# Patient Record
Sex: Male | Born: 1970 | Race: White | Hispanic: No | Marital: Married | Smoking: Never smoker
Health system: Southern US, Community
[De-identification: ages and names within clinical notes are randomized; demographics above are authoritative.]

## PROBLEM LIST (undated history)

## (undated) DIAGNOSIS — I10 Essential (primary) hypertension: Secondary | ICD-10-CM

## (undated) HISTORY — PX: TONSILLECTOMY: SUR1361

## (undated) HISTORY — PX: SHOULDER SURGERY: SHX246

## (undated) HISTORY — DX: Essential (primary) hypertension: I10

---

## 1998-12-03 ENCOUNTER — Other Ambulatory Visit: Admission: RE | Admit: 1998-12-03 | Discharge: 1998-12-03 | Payer: Self-pay | Admitting: Otolaryngology

## 2000-06-17 ENCOUNTER — Ambulatory Visit (HOSPITAL_COMMUNITY): Admission: RE | Admit: 2000-06-17 | Discharge: 2000-06-17 | Payer: Self-pay | Admitting: Gastroenterology

## 2000-06-17 ENCOUNTER — Encounter: Payer: Self-pay | Admitting: Gastroenterology

## 2000-06-17 ENCOUNTER — Encounter: Admission: RE | Admit: 2000-06-17 | Discharge: 2000-06-17 | Payer: Self-pay | Admitting: Gastroenterology

## 2002-08-06 ENCOUNTER — Ambulatory Visit (HOSPITAL_BASED_OUTPATIENT_CLINIC_OR_DEPARTMENT_OTHER): Admission: RE | Admit: 2002-08-06 | Discharge: 2002-08-06 | Payer: Self-pay | Admitting: Orthopedic Surgery

## 2002-08-06 ENCOUNTER — Encounter: Payer: Self-pay | Admitting: Orthopedic Surgery

## 2002-08-06 ENCOUNTER — Encounter: Payer: Self-pay | Admitting: Emergency Medicine

## 2002-08-06 ENCOUNTER — Emergency Department (HOSPITAL_COMMUNITY): Admission: EM | Admit: 2002-08-06 | Discharge: 2002-08-06 | Payer: Self-pay | Admitting: Emergency Medicine

## 2003-09-26 ENCOUNTER — Ambulatory Visit (HOSPITAL_BASED_OUTPATIENT_CLINIC_OR_DEPARTMENT_OTHER): Admission: RE | Admit: 2003-09-26 | Discharge: 2003-09-26 | Payer: Self-pay | Admitting: Orthopedic Surgery

## 2003-09-26 ENCOUNTER — Ambulatory Visit (HOSPITAL_COMMUNITY): Admission: RE | Admit: 2003-09-26 | Discharge: 2003-09-26 | Payer: Self-pay | Admitting: Orthopedic Surgery

## 2003-12-16 ENCOUNTER — Emergency Department (HOSPITAL_COMMUNITY): Admission: EM | Admit: 2003-12-16 | Discharge: 2003-12-16 | Payer: Self-pay | Admitting: Emergency Medicine

## 2005-09-11 ENCOUNTER — Emergency Department (HOSPITAL_COMMUNITY): Admission: EM | Admit: 2005-09-11 | Discharge: 2005-09-11 | Payer: Self-pay | Admitting: *Deleted

## 2014-01-27 ENCOUNTER — Encounter (HOSPITAL_COMMUNITY): Payer: Self-pay | Admitting: Emergency Medicine

## 2014-01-27 ENCOUNTER — Emergency Department (HOSPITAL_COMMUNITY): Payer: Managed Care, Other (non HMO)

## 2014-01-27 ENCOUNTER — Emergency Department (HOSPITAL_COMMUNITY)
Admission: EM | Admit: 2014-01-27 | Discharge: 2014-01-27 | Disposition: A | Discharge status: Home / Self Care | Payer: Managed Care, Other (non HMO) | Attending: Emergency Medicine | Admitting: Emergency Medicine

## 2014-01-27 DIAGNOSIS — IMO0002 Reserved for concepts with insufficient information to code with codable children: Secondary | ICD-10-CM | POA: Insufficient documentation

## 2014-01-27 DIAGNOSIS — Y92410 Unspecified street and highway as the place of occurrence of the external cause: Secondary | ICD-10-CM | POA: Insufficient documentation

## 2014-01-27 DIAGNOSIS — S2249XA Multiple fractures of ribs, unspecified side, initial encounter for closed fracture: Secondary | ICD-10-CM | POA: Insufficient documentation

## 2014-01-27 DIAGNOSIS — M546 Pain in thoracic spine: Secondary | ICD-10-CM

## 2014-01-27 DIAGNOSIS — Z79899 Other long term (current) drug therapy: Secondary | ICD-10-CM | POA: Insufficient documentation

## 2014-01-27 DIAGNOSIS — S0510XA Contusion of eyeball and orbital tissues, unspecified eye, initial encounter: Secondary | ICD-10-CM | POA: Insufficient documentation

## 2014-01-27 DIAGNOSIS — S2232XA Fracture of one rib, left side, initial encounter for closed fracture: Secondary | ICD-10-CM

## 2014-01-27 DIAGNOSIS — Y9389 Activity, other specified: Secondary | ICD-10-CM | POA: Insufficient documentation

## 2014-01-27 MED ORDER — MELOXICAM 7.5 MG PO TABS: 15.0000 mg | ORAL_TABLET | Freq: Every day | ORAL | Status: DC

## 2014-01-27 MED ORDER — OXYCODONE-ACETAMINOPHEN 5-325 MG PO TABS: ORAL_TABLET | ORAL | Status: DC
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
## 2014-01-27 NOTE — ED Notes (Signed)
Patient transported to X-ray 

## 2014-01-27 NOTE — Discharge Instructions (Signed)
You may take meloxicam (mobic) an antiinflammatory once daily for next 7 days to help with inflammation and pain.  Then take daily as needed for pain.  Percocet is for moderate to severe pain. Do not drive while taking as it may cause drowsiness. Follow up with primary care for recheck of wounds and ongoing pain as needed.  Use incentive spirometry as demonstrated by nursing staff to help prevent pneumonia.  See below for further instruction.
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
## 2014-01-27 NOTE — ED Notes (Signed)
C/o left rib pain, hurts to move- unable to lift left arm due to pain in ribs.

## 2014-01-28 NOTE — ED Provider Notes (Signed)
Medical screening examination/treatment/procedure(s) were performed by non-physician practitioner and as supervising physician I was immediately available for consultation/collaboration.   Shanna Cisco, MD 01/28/14 [phone]

## 2018-11-01 ENCOUNTER — Other Ambulatory Visit: Payer: Self-pay | Admitting: Orthopedic Surgery

## 2018-11-01 DIAGNOSIS — S43432A Superior glenoid labrum lesion of left shoulder, initial encounter: Secondary | ICD-10-CM

## 2018-11-03 ENCOUNTER — Other Ambulatory Visit: Payer: Self-pay | Admitting: Otolaryngology

## 2018-11-09 ENCOUNTER — Other Ambulatory Visit: Payer: Managed Care, Other (non HMO)

## 2018-11-16 ENCOUNTER — Ambulatory Visit
Admission: RE | Admit: 2018-11-16 | Discharge: 2018-11-16 | Disposition: A | Discharge status: Home / Self Care | Payer: 59 | Source: Ambulatory Visit | Attending: Orthopedic Surgery | Admitting: Orthopedic Surgery

## 2018-11-16 DIAGNOSIS — S43432A Superior glenoid labrum lesion of left shoulder, initial encounter: Secondary | ICD-10-CM

## 2018-11-16 MED ORDER — IOPAMIDOL (ISOVUE-M 200) INJECTION 41%
15.0000 mL | Freq: Once | INTRAMUSCULAR | Status: AC
  Administered 2018-11-16: 15 mL via INTRA_ARTICULAR

## 2019-11-27 ENCOUNTER — Other Ambulatory Visit: Payer: Self-pay | Admitting: Orthopedic Surgery

## 2019-11-27 DIAGNOSIS — M25511 Pain in right shoulder: Secondary | ICD-10-CM

## 2019-12-10 ENCOUNTER — Ambulatory Visit
Admission: RE | Admit: 2019-12-10 | Discharge: 2019-12-10 | Disposition: A | Discharge status: Home / Self Care | Payer: 59 | Source: Ambulatory Visit | Attending: Orthopedic Surgery | Admitting: Orthopedic Surgery

## 2019-12-10 ENCOUNTER — Other Ambulatory Visit: Payer: Self-pay

## 2019-12-10 DIAGNOSIS — M25511 Pain in right shoulder: Secondary | ICD-10-CM

## 2019-12-10 MED ORDER — IOPAMIDOL (ISOVUE-M 200) INJECTION 41%
13.0000 mL | Freq: Once | INTRAMUSCULAR | Status: AC
  Administered 2019-12-10: 13 mL via INTRA_ARTICULAR

## 2019-12-21 ENCOUNTER — Other Ambulatory Visit: Payer: 59

## 2020-04-18 ENCOUNTER — Encounter: Payer: Self-pay | Admitting: Cardiology

## 2020-04-18 ENCOUNTER — Other Ambulatory Visit: Payer: Self-pay

## 2020-04-18 ENCOUNTER — Ambulatory Visit: Payer: 59 | Admitting: Cardiology

## 2020-04-18 VITALS — BP 172/112 | HR 66 | Resp 17 | Ht 70.0 in | Wt 214.0 lb

## 2020-04-18 DIAGNOSIS — R0683 Snoring: Secondary | ICD-10-CM

## 2020-04-18 DIAGNOSIS — I10 Essential (primary) hypertension: Secondary | ICD-10-CM

## 2020-04-18 MED ORDER — AMLODIPINE BESYLATE-VALSARTAN 5-160 MG PO TABS: 1.0000 | ORAL_TABLET | Freq: Every day | ORAL | 2 refills | Status: DC

## 2020-04-18 NOTE — Progress Notes (Signed)
Primary Physician/Referring:  Patient, No Pcp Per  Patient ID: Julian Coffey, male    DOB: 09/24/1970, 49 y.o.   MRN: [id number]  Chief Complaint  Patient presents with  . Hypertension  . New Patient (Initial Visit)   HPI:    Julian Coffey  "Julian Coffey" is a 49 y.o. Caucasian male self-referred to me for evaluation of hypertension.  Recently underwent left shoulder arthroplasty, was found to have markedly elevated blood pressure and the procedure was on the verge of being canceled.  But due to his excellent health in general and good exercise tolerance underwent surgery without any periprocedural complications.  States that for the past couple years, he has noticed elevated blood pressure but did not think much about it.  Recently he has been noticing heaviness in his head and generally not feeling well.  He has continued to exercise regularly.  Past Medical History:  Diagnosis Date  . Hypertension    Past Surgical History:  Procedure Laterality Date  . SHOULDER SURGERY Bilateral   . TONSILLECTOMY     Family History  Problem Relation Age of Onset  . Hypertension Mother   . Hypertension Father     Social History   Tobacco Use  . Smoking status: Never Smoker  . Smokeless tobacco: Current User    Types: Snuff  Substance Use Topics  . Alcohol use: Yes    Comment: occ   Marital Status: Married  ROS  Review of Systems  Cardiovascular: Negative for chest pain, dyspnea on exertion and leg swelling.  Respiratory: Positive for snoring.   Gastrointestinal: Negative for melena.   Objective  Blood pressure (!) 172/112, pulse 66, resp. rate 17, height '5\' 10"'  (1.778 m), weight (!) 214 lb (97.1 kg), SpO2 99 %.  Vitals with BMI 04/18/2020 01/27/2014  Height '5\' 10"'  '5\' 10"'   Weight 214 lbs 200 lbs  BMI 53.61 44.3  Systolic 154 008  Diastolic 676 195  Pulse 66 64     Physical Exam Constitutional:      Appearance: He is obese.  Cardiovascular:     Rate and Rhythm: Normal rate and  regular rhythm.     Pulses: Intact distal pulses.     Heart sounds: Normal heart sounds. No murmur heard.  No gallop.      Comments: No leg edema, no JVD. Pulmonary:     Effort: Pulmonary effort is normal.     Breath sounds: Normal breath sounds.  Abdominal:     General: Bowel sounds are normal.     Palpations: Abdomen is soft.    Laboratory examination:   Recent Labs    04/18/20 1636  NA 139  K 5.1  CL 99  CO2 27  GLUCOSE 85  BUN 15  CREATININE 1.10  CALCIUM 9.3  GFRNONAA 77  GFRAA 89   estimated creatinine clearance is 92.8 mL/min (by C-G formula based on SCr of 1.1 mg/dL).  CMP Latest Ref Rng & Units 04/18/2020  Glucose 65 - 99 mg/dL 85  BUN 6 - 24 mg/dL 15  Creatinine 0.76 - 1.27 mg/dL 1.10  Sodium 134 - 144 mmol/L 139  Potassium 3.5 - 5.2 mmol/L 5.1  Chloride 96 - 106 mmol/L 99  CO2 20 - 29 mmol/L 27  Calcium 8.7 - 10.2 mg/dL 9.3   No flowsheet data found.  Lipid Panel No results for input(s): CHOL, TRIG, LDLCALC, VLDL, HDL, CHOLHDL, LDLDIRECT in the last 8760 hours.  HEMOGLOBIN A1C No results found for: HGBA1C, MPG  TSH No results for input(s): TSH in the last 8760 hours.  Medications and allergies  Not on File   Current Outpatient Medications  Medication Instructions  . amLODipine-valsartan (EXFORGE) 5-160 MG tablet 1 tablet, Oral, Daily  . omeprazole (PRILOSEC) 20 MG capsule 1 capsule, Oral, Daily    Radiology:   No results found.  Cardiac Studies:   EKG  EKG 04/18/2020: Normal sinus rhythm at rate of 64 bpm, leftward axis.  Poor R wave progression, probably normal variant but cannot exclude anteroseptal infarct old.  No evidence of ischemia, normal QT interval.      Assessment     ICD-10-CM   1. Accelerated hypertension  I10 EKG 12-Lead    amLODipine-valsartan (EXFORGE) 5-160 MG tablet    Ambulatory referral to Sleep Studies    Basic metabolic panel    PCV ECHOCARDIOGRAM COMPLETE    Lipid Panel With LDL/HDL Ratio    TSH     CMP14+EGFR    CBC  2. Loud snoring  R06.83 Ambulatory referral to Sleep Studies     Medications Discontinued During This Encounter  Medication Reason  . meloxicam (MOBIC) 7.5 MG tablet Completed Course  . oxyCODONE-acetaminophen (PERCOCET/ROXICET) 5-325 MG per tablet Completed Course  . PRESCRIPTION MEDICATION Completed Course    Recommendations:   FIN HUPP  "Julian Coffey" is a 49 y.o. Caucasian male self-referred to me for evaluation of hypertension.  Recently underwent left shoulder arthroplasty, was found to have markedly elevated blood pressure and the procedure was on the verge of being canceled.  But due to his excellent health in general and good exercise tolerance underwent surgery without any periprocedural complications.  States that for the past couple years, he has noticed elevated blood pressure but did not think much about it.  I obtain BMP today to establish a baseline, potassium is mildly elevated, however he should be able to tolerate amlodipine valsartan, I will recheck labs in 2 weeks.  He will return back to the office in 4 to 6 weeks, will also obtain an echocardiogram.  I have discussed with him regarding need for emergent evaluation and signs and symptoms of CHF, hypertensive emergency, etc.   He also admits to very loud snoring throughout the night, suspected sleep apnea may be contributing to his significant elevation in blood pressure as well.  We will make a referral to be evaluated by Dr. Roddie Mc.  Do not suspect angina pectoris or significant CAD in view of the fact that he exercises regularly and runs at least 3 to 5 miles almost every 2 to 3 days.    Adrian Prows, MD, [facility] 04/19/2020, 2:44 PM Office: [phone]

## 2020-04-19 ENCOUNTER — Other Ambulatory Visit: Payer: Self-pay | Admitting: Cardiology

## 2020-04-19 DIAGNOSIS — I10 Essential (primary) hypertension: Secondary | ICD-10-CM

## 2020-04-19 LAB — BASIC METABOLIC PANEL
BUN/Creatinine Ratio: 14 (ref 9–20)
BUN: 15 mg/dL (ref 6–24)
CO2: 27 mmol/L (ref 20–29)
Calcium: 9.3 mg/dL (ref 8.7–10.2)
Chloride: 99 mmol/L (ref 96–106)
Creatinine, Ser: 1.1 mg/dL (ref 0.76–1.27)
GFR calc Af Amer: 89 mL/min/{1.73_m2} (ref >59)
GFR calc non Af Amer: 77 mL/min/{1.73_m2} (ref >59)
Glucose: 85 mg/dL (ref 65–99)
Potassium: 5.1 mmol/L (ref 3.5–5.2)
Sodium: 139 mmol/L (ref 134–144)

## 2020-05-02 LAB — CMP14+EGFR
ALT: 54 IU/L — ABNORMAL HIGH (ref 0–44)
AST: 42 IU/L — ABNORMAL HIGH (ref 0–40)
Albumin/Globulin Ratio: 1.8 (ref 1.2–2.2)
Albumin: 4.6 g/dL (ref 3.8–4.9)
Alkaline Phosphatase: 88 IU/L (ref 48–121)
BUN/Creatinine Ratio: 12 (ref 9–20)
BUN: 14 mg/dL (ref 6–24)
Bilirubin Total: 0.3 mg/dL (ref 0.0–1.2)
CO2: 25 mmol/L (ref 20–29)
Calcium: 9.5 mg/dL (ref 8.7–10.2)
Chloride: 102 mmol/L (ref 96–106)
Creatinine, Ser: 1.17 mg/dL (ref 0.76–1.27)
GFR calc Af Amer: 83 mL/min/{1.73_m2} (ref >59)
GFR calc non Af Amer: 72 mL/min/{1.73_m2} (ref >59)
Globulin, Total: 2.5 g/dL (ref 1.5–4.5)
Glucose: 108 mg/dL — ABNORMAL HIGH (ref 65–99)
Potassium: 5 mmol/L (ref 3.5–5.2)
Sodium: 140 mmol/L (ref 134–144)
Total Protein: 7.1 g/dL (ref 6.0–8.5)

## 2020-05-02 LAB — CBC
Hematocrit: 46.4 % (ref 37.5–51.0)
Hemoglobin: 16.7 g/dL (ref 13.0–17.7)
MCH: 32.8 pg (ref 26.6–33.0)
MCHC: 36 g/dL — ABNORMAL HIGH (ref 31.5–35.7)
MCV: 91 fL (ref 79–97)
Platelets: 231 10*3/uL (ref 150–450)
RBC: 5.09 x10E6/uL (ref 4.14–5.80)
RDW: 13.1 % (ref 11.6–15.4)
WBC: 3.5 10*3/uL (ref 3.4–10.8)

## 2020-05-02 LAB — LIPID PANEL WITH LDL/HDL RATIO
Cholesterol, Total: 329 mg/dL — ABNORMAL HIGH (ref 100–199)
HDL: 49 mg/dL (ref >39)
LDL Chol Calc (NIH): 231 mg/dL — ABNORMAL HIGH (ref 0–99)
LDL/HDL Ratio: 4.7 ratio — ABNORMAL HIGH (ref 0.0–3.6)
Triglycerides: 241 mg/dL — ABNORMAL HIGH (ref 0–149)
VLDL Cholesterol Cal: 49 mg/dL — ABNORMAL HIGH (ref 5–40)

## 2020-05-02 LAB — TSH: TSH: 0.924 u[IU]/mL (ref 0.450–4.500)

## 2020-05-05 ENCOUNTER — Ambulatory Visit: Payer: 59

## 2020-05-05 ENCOUNTER — Other Ambulatory Visit: Payer: Self-pay

## 2020-05-05 DIAGNOSIS — I10 Essential (primary) hypertension: Secondary | ICD-10-CM

## 2020-05-08 ENCOUNTER — Other Ambulatory Visit: Payer: Self-pay | Admitting: Cardiology

## 2020-05-08 DIAGNOSIS — I428 Other cardiomyopathies: Secondary | ICD-10-CM

## 2020-05-08 DIAGNOSIS — E7800 Pure hypercholesterolemia, unspecified: Secondary | ICD-10-CM

## 2020-05-08 DIAGNOSIS — I10 Essential (primary) hypertension: Secondary | ICD-10-CM

## 2020-05-08 MED ORDER — ATORVASTATIN CALCIUM 80 MG PO TABS: 80.0000 mg | ORAL_TABLET | Freq: Every day | ORAL | 2 refills | Status: DC

## 2020-05-27 ENCOUNTER — Institutional Professional Consult (permissible substitution): Payer: Self-pay | Admitting: Neurology

## 2020-06-02 ENCOUNTER — Other Ambulatory Visit: Payer: Self-pay

## 2020-06-02 ENCOUNTER — Ambulatory Visit: Payer: 59 | Admitting: Cardiology

## 2020-06-02 ENCOUNTER — Encounter: Payer: Self-pay | Admitting: Cardiology

## 2020-06-02 VITALS — BP 136/84 | HR 77 | Resp 15 | Ht 70.0 in | Wt 210.0 lb

## 2020-06-02 DIAGNOSIS — I10 Essential (primary) hypertension: Secondary | ICD-10-CM

## 2020-06-02 DIAGNOSIS — I428 Other cardiomyopathies: Secondary | ICD-10-CM

## 2020-06-02 DIAGNOSIS — E7800 Pure hypercholesterolemia, unspecified: Secondary | ICD-10-CM

## 2020-06-02 MED ORDER — ATORVASTATIN CALCIUM 80 MG PO TABS: 80.0000 mg | ORAL_TABLET | Freq: Every day | ORAL | 3 refills | Status: DC

## 2020-06-02 MED ORDER — AMLODIPINE BESYLATE-VALSARTAN 5-160 MG PO TABS: 1.0000 | ORAL_TABLET | Freq: Every day | ORAL | 3 refills | Status: DC

## 2020-06-02 NOTE — Progress Notes (Signed)
Primary Physician/Referring:  Patient, No Pcp Per  Patient ID: QUAMAINE WEBB, male    DOB: [birth year], 49 y.o.   MRN: [id number]  Chief Complaint  Patient presents with  . Follow-up    4 weeks  . Hypertension   HPI:    OMA ALPERT  "Gaylyn Rong" is a 49 y.o. Caucasian male self-referred to me for evaluation of hypertension.  Recently underwent left shoulder arthroplasty, was found to have markedly elevated blood pressure and the procedure was on the verge of being canceled.  But due to his excellent health in general and good exercise tolerance underwent surgery without any periprocedural complications.  Presents for 4 week follow up on hypertension, hyperlipidemia, and results of echocardiogram.  At last visit started patient on amlodipine/valsartan 5/160 mg, started on atorvastatin 80 mg, and referred him to Dr. Kandyce Rud for sleep evaluation.  Patient has not gone forward with sleep evaluation at this time.  He reports that he only snores after he has consumed 6-7 alcoholic beverages.  He does not feel he needs sleep evaluation at this point.    Patient monitors blood pressure regularly at home reports numbers averaging 130s/80s.  He is currently asymptomatic, reports the "heaviness in his head" has resolved since starting amlodipine/valsartan.  Denies chest pain, shortness of breath, palpitations, swelling.   Past Medical History:  Diagnosis Date  . Hypertension    Past Surgical History:  Procedure Laterality Date  . SHOULDER SURGERY Bilateral   . TONSILLECTOMY     Family History  Problem Relation Age of Onset  . Hypertension Mother   . Hypertension Father     Social History   Tobacco Use  . Smoking status: Never Smoker  . Smokeless tobacco: Current User    Types: Snuff  Substance Use Topics  . Alcohol use: Yes    Comment: occ   Marital Status: Married   ROS  Review of Systems  Eyes: Negative for visual disturbance.  Cardiovascular: Negative for chest pain, claudication,  dyspnea on exertion, leg swelling, palpitations and syncope.  Respiratory: Positive for snoring (after alcohol consumption ).   Gastrointestinal: Negative for melena.  Neurological: Negative for dizziness and headaches.   Objective  Blood pressure 136/84, pulse 77, resp. rate 15, height 5\' 10"  (1.778 m), weight 210 lb (95.3 kg), SpO2 96 %.  Vitals with BMI 06/02/2020 06/02/2020 04/18/2020  Height - 5\' 10"  5\' 10"   Weight - 210 lbs 214 lbs  BMI - 30.13 30.71  Systolic 136 149 04/20/2020  Diastolic 84 103 112  Pulse 77 71 66     Physical Exam Constitutional:      General: He is not in acute distress.    Appearance: He is obese. He is not ill-appearing.  Cardiovascular:     Rate and Rhythm: Normal rate and regular rhythm.     Pulses: Intact distal pulses.     Heart sounds: Normal heart sounds. No murmur heard.  No gallop.      Comments: No leg edema, no JVD. Pulmonary:     Effort: Pulmonary effort is normal.     Breath sounds: Normal breath sounds.  Neurological:     Mental Status: He is alert.    Laboratory examination:   Recent Labs    04/18/20 1636 05/01/20 0815  NA 139 140  K 5.1 5.0  CL 99 102  CO2 27 25  GLUCOSE 85 108*  BUN 15 14  CREATININE 1.10 1.17  CALCIUM 9.3 9.5  GFRNONAA 77 72  GFRAA 89 83   CrCl cannot be calculated (Patient's most recent lab result is older than the maximum 21 days allowed.).  CMP Latest Ref Rng & Units 05/01/2020 04/18/2020  Glucose 65 - 99 mg/dL 291(B) 85  BUN 6 - 24 mg/dL 14 15  Creatinine 1.66 - 1.27 mg/dL 0.60 0.45  Sodium 997 - 144 mmol/L 140 139  Potassium 3.5 - 5.2 mmol/L 5.0 5.1  Chloride 96 - 106 mmol/L 102 99  CO2 20 - 29 mmol/L 25 27  Calcium 8.7 - 10.2 mg/dL 9.5 9.3  Total Protein 6.0 - 8.5 g/dL 7.1 -  Total Bilirubin 0.0 - 1.2 mg/dL 0.3 -  Alkaline Phos 48 - 121 IU/L 88 -  AST 0 - 40 IU/L 42(H) -  ALT 0 - 44 IU/L 54(H) -   CBC Latest Ref Rng & Units 05/01/2020  WBC 3.4 - 10.8 x10E3/uL 3.5  Hemoglobin 13.0 - 17.7 g/dL  74.1  Hematocrit 42.3 - 51.0 % 46.4  Platelets 150 - 450 x10E3/uL 231    Lipid Panel Recent Labs    05/01/20 0815  CHOL 329*  TRIG 241*  LDLCALC 231*  HDL 49    HEMOGLOBIN A1C No results found for: HGBA1C, MPG TSH Recent Labs    05/01/20 0816  TSH 0.924    Medications and allergies  No Known Allergies   Current Outpatient Medications  Medication Instructions  . amLODipine-valsartan (EXFORGE) 5-160 MG tablet 1 tablet, Oral, Daily  . atorvastatin (LIPITOR) 80 mg, Oral, Daily  . omeprazole (PRILOSEC) 20 MG capsule 1 capsule, Oral, Daily    Radiology:   No results found.  Cardiac Studies:  Echocardiogram 05/05/2020:  Left ventricle cavity is normal in size. Mild concentric hypertrophy of the left ventricle. Mild global hypokinesis. LVEF 45-50%. Normal global wall motion. Normal diastolic filling pattern.  The aortic root is mildly dilated at 3.9 cm.  Trileaflet aortic valve. Mild (Grade I) aortic regurgitation.  Moderate pulmonic regurgitation.  Normal right atrial pressure.    EKG  EKG 04/18/2020: Normal sinus rhythm at rate of 64 bpm, leftward axis.  Poor R wave progression, probably normal variant but cannot exclude anteroseptal infarct old.  No evidence of ischemia, normal QT interval.      Assessment     ICD-10-CM   1. Accelerated hypertension  I10 amLODipine-valsartan (EXFORGE) 5-160 MG tablet  2. Hypercholesteremia  E78.00 Lipid Panel With LDL/HDL Ratio    CT CARDIAC SCORING    atorvastatin (LIPITOR) 80 MG tablet  3. Other cardiomyopathy (HCC)  I42.8 CT CARDIAC SCORING     Medications Discontinued During This Encounter  Medication Reason  . amLODipine-valsartan (EXFORGE) 5-160 MG tablet Reorder  . atorvastatin (LIPITOR) 80 MG tablet Reorder    Recommendations:   JARRIS KORTZ  "Gaylyn Rong" is a 49 y.o. Caucasian male self-referred to me for evaluation of hypertension.  Recently underwent left shoulder arthroplasty, was found to have markedly elevated  blood pressure and the procedure was on the verge of being canceled.  But due to his excellent health in general and good exercise tolerance underwent surgery without any periprocedural complications.  Presents for 4-week follow-up on hypertension, hypercholesterolemia, and results of echocardiogram.  Discussed with patient that echocardiogram revealed mildly decreased LVEF at 45 to 50%.  This reduction in ejection fraction may be secondary to systemic hypertension/hyertensive heart disease, however in light of markedly elevated cholesterol will obtain nuclear stress test to evaluate for coronary artery disease.  Will also order CT cardiac scoring due to  markedly elevated LDL at 231.  Patient is currently taking atorvastatin 80 mg daily, will continue this and recheck lipid profile testing in 4 to 6 weeks.  In regard to hypertension, patient is tolerating amlodipine/valsartan well and renal function is stable.  However patient's blood pressure is still mildly elevated.  Discussed with patient the option of making medication adjustments, however he prefers at this time to focus on losing weight which is likely to improve blood pressure control.  Informed patient to avoid high calorie foods, with the goal of losing 10 pounds in the next 6 to 8 weeks to reach goal weight of 200 pounds.  Of note patient prefers not to do sleep study for evaluation of sleep apnea at this time.  Discussed with patient that if he does have underlying sleep apnea it could be contributing to his hypertension.  Patient expressed understanding but prefers to see if losing weight can improve his blood pressure control before going forward with sleep evaluation.   Will follow up in 6 months on hypercholesterolemia, hypertension, and results of nuclear stress test.  Patient was seen in collaboration with Dr. Jacinto Halim. He also reviewed patient's chart and examined the patient. Dr. Jacinto Halim is in agreement of the plan.    Rayford Halsted,  PA-C 06/02/2020, 4:19 PM Office: [phone]

## 2020-06-11 ENCOUNTER — Ambulatory Visit: Payer: 59

## 2020-06-11 ENCOUNTER — Other Ambulatory Visit: Payer: Self-pay

## 2020-06-11 DIAGNOSIS — I10 Essential (primary) hypertension: Secondary | ICD-10-CM

## 2020-06-11 DIAGNOSIS — E7800 Pure hypercholesterolemia, unspecified: Secondary | ICD-10-CM

## 2020-06-11 DIAGNOSIS — I428 Other cardiomyopathies: Secondary | ICD-10-CM

## 2020-06-24 ENCOUNTER — Ambulatory Visit
Admission: RE | Admit: 2020-06-24 | Discharge: 2020-06-24 | Disposition: A | Discharge status: Home / Self Care | Payer: 59 | Source: Ambulatory Visit | Attending: Student | Admitting: Student

## 2020-06-24 ENCOUNTER — Other Ambulatory Visit: Payer: 59

## 2020-06-24 DIAGNOSIS — I428 Other cardiomyopathies: Secondary | ICD-10-CM

## 2020-06-24 DIAGNOSIS — E7800 Pure hypercholesterolemia, unspecified: Secondary | ICD-10-CM

## 2020-06-25 ENCOUNTER — Telehealth: Payer: Self-pay

## 2020-06-25 ENCOUNTER — Institutional Professional Consult (permissible substitution): Payer: Self-pay | Admitting: Neurology

## 2020-06-25 NOTE — Telephone Encounter (Signed)
Relayed information to patient regarding calcium score. Patient voiced understanding and will do repeat blood work at the end of the month (fasting)

## 2020-06-25 NOTE — Progress Notes (Signed)
Please inform patient that coronary calcium score is mildly elevated, meaning we need to keep your cholesterol controlled to prevent more calcium in your heart arteries. Please keep taking your atorvastatin and get blood work done at [facility] in a fasting state at the end of Oct. To recheck your cholesterol.

## 2020-06-25 NOTE — Telephone Encounter (Signed)
-----   Message from Rayford Halsted, [place] sent at 06/25/2020  8:59 AM EDT ----- Please inform patient that coronary calcium score is mildly elevated, meaning we need to keep your cholesterol controlled to prevent more calcium in your heart arteries. Please keep taking your atorvastatin and get blood work done at [facility] in a fas ting state at the end of Oct. To recheck your cholesterol.

## 2020-06-25 NOTE — Telephone Encounter (Signed)
-----   Message from Celeste C Cantwell, PA-C sent at 06/25/2020  8:59 AM EDT ----- Please inform patient that coronary calcium score is mildly elevated, meaning we need to keep your cholesterol controlled to prevent more calcium in your heart arteries. Please keep taking your atorvastatin and get blood work done at [facility] in a fas ting state at the end of Oct. To recheck your cholesterol.   

## 2020-07-18 ENCOUNTER — Other Ambulatory Visit: Payer: Self-pay | Admitting: Student

## 2020-07-19 LAB — LIPID PANEL WITH LDL/HDL RATIO
Cholesterol, Total: 224 mg/dL — ABNORMAL HIGH (ref 100–199)
HDL: 55 mg/dL (ref >39)
LDL Chol Calc (NIH): 134 mg/dL — ABNORMAL HIGH (ref 0–99)
LDL/HDL Ratio: 2.4 ratio (ref 0.0–3.6)
Triglycerides: 198 mg/dL — ABNORMAL HIGH (ref 0–149)
VLDL Cholesterol Cal: 35 mg/dL (ref 5–40)

## 2020-07-21 ENCOUNTER — Encounter: Payer: Self-pay | Admitting: Student

## 2020-07-21 ENCOUNTER — Telehealth: Payer: Self-pay | Admitting: Student

## 2020-07-21 DIAGNOSIS — E7800 Pure hypercholesterolemia, unspecified: Secondary | ICD-10-CM

## 2020-07-21 NOTE — Progress Notes (Signed)
Call patient and discussed results

## 2020-07-21 NOTE — Telephone Encounter (Signed)
Discussed with patient that lipids are still elevated above goal. Recommended that he start Zetia in addition to Lipitor. Discussed the benefits of being aggressive with lipid management in light of his elevated coronary calcium score. Patient reports he feels well and doesn't like taking medications. He is active and eats well. Patient understands the benefits of addition of Zetia but wishes not to start it at this time despite my recommendation. He prefers to continue with lifestyle modifications. Will obtain repeat lipid profile testing prior to next office visit. Will reevaluate lipid management at next office visit.

## 2020-12-02 LAB — LIPID PANEL WITH LDL/HDL RATIO
Cholesterol, Total: 213 mg/dL — ABNORMAL HIGH (ref 100–199)
HDL: 50 mg/dL (ref >39)
LDL Chol Calc (NIH): 134 mg/dL — ABNORMAL HIGH (ref 0–99)
LDL/HDL Ratio: 2.7 ratio (ref 0.0–3.6)
Triglycerides: 163 mg/dL — ABNORMAL HIGH (ref 0–149)
VLDL Cholesterol Cal: 29 mg/dL (ref 5–40)

## 2020-12-03 ENCOUNTER — Other Ambulatory Visit: Payer: Self-pay

## 2020-12-03 ENCOUNTER — Encounter: Payer: Self-pay | Admitting: Cardiology

## 2020-12-03 ENCOUNTER — Ambulatory Visit: Payer: 59 | Admitting: Cardiology

## 2020-12-03 VITALS — BP 120/89 | HR 72 | Temp 97.7°F | Resp 17 | Ht 70.0 in | Wt 210.8 lb

## 2020-12-03 DIAGNOSIS — I10 Essential (primary) hypertension: Secondary | ICD-10-CM

## 2020-12-03 DIAGNOSIS — E7800 Pure hypercholesterolemia, unspecified: Secondary | ICD-10-CM

## 2020-12-03 MED ORDER — AMLODIPINE BESYLATE-VALSARTAN 10-320 MG PO TABS: 1.0000 | ORAL_TABLET | Freq: Every evening | ORAL | 3 refills | Status: DC

## 2020-12-03 MED ORDER — EZETIMIBE 10 MG PO TABS: 10.0000 mg | ORAL_TABLET | Freq: Every day | ORAL | 1 refills | Status: DC

## 2020-12-03 MED ORDER — ATORVASTATIN CALCIUM 80 MG PO TABS
80.0000 mg | ORAL_TABLET | Freq: Every day | ORAL | 3 refills | Status: DC
Start: 2020-12-03 — End: 2021-09-28

## 2020-12-03 NOTE — Progress Notes (Signed)
Primary Physician/Referring:  Patient, No Pcp Per  Patient ID: Julian Coffey, male    DOB: 18-Feb-1971, 50 y.o.   MRN: [id number]  No chief complaint on file.  HPI:    Julian Coffey  "Julian Coffey" is a 50 y.o. 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  He is tolerating atorvastatin 80 mg without any side effects.  Also tolerating amlodipine valsartan combination.  No chest pain or dyspnea or dizziness.  No specific complaints today.  He has made lifestyle changes and has made efforts to lose some weight and has lost 6 pounds.   Past Medical History:  Diagnosis Date  . Hypertension    Past Surgical History:  Procedure Laterality Date  . SHOULDER SURGERY Bilateral   . TONSILLECTOMY     Family History  Problem Relation Age of Onset  . Hypertension Mother   . Hypertension Father     Social History   Tobacco Use  . Smoking status: Never Smoker  . Smokeless tobacco: Current User    Types: Snuff  Substance Use Topics  . Alcohol use: Yes    Comment: occ   Marital Status: Married   ROS  Review of Systems  Eyes: Negative for visual disturbance.  Cardiovascular: Negative for chest pain, claudication, dyspnea on exertion, leg swelling, palpitations and syncope.  Respiratory: Positive for snoring (after alcohol consumption ).   Gastrointestinal: Negative for melena.  Neurological: Negative for dizziness and headaches.   Objective  Blood pressure 120/89, pulse 72, temperature 97.7 F (36.5 C), temperature source Temporal, resp. rate 17, height '5\' 10"'  (1.778 m), weight 210 lb 12.8 oz (95.6 kg), SpO2 99 %.  Vitals with BMI 12/03/2020 06/02/2020 06/02/2020  Height '5\' 10"'  - '5\' 10"'   Weight 210 lbs 13 oz - 210 lbs  BMI 38.93 - 73.42  Systolic 876 811  572  Diastolic 89 84 620  Pulse 72 77 71     Physical Exam Constitutional:      General: He is not in acute distress.    Appearance: He is obese. He is not ill-appearing.  Cardiovascular:     Rate and Rhythm: Normal rate and regular rhythm.     Pulses: Intact distal pulses.     Heart sounds: Normal heart sounds. No murmur heard. No gallop.      Comments: No leg edema, no JVD. Pulmonary:     Effort: Pulmonary effort is normal.     Breath sounds: Normal breath sounds.  Neurological:     Mental Status: He is alert.    Laboratory examination:   Recent Labs    04/18/20 1636 05/01/20 0815  NA 139 140  K 5.1 5.0  CL 99 102  CO2 27 25  GLUCOSE 85 108*  BUN 15 14  CREATININE 1.10 1.17  CALCIUM 9.3 9.5  GFRNONAA 77 72  GFRAA 89 83   CrCl cannot be calculated (Patient's most recent lab result is older than the maximum 21 days allowed.).  CMP Latest Ref Rng & Units 05/01/2020 04/18/2020  Glucose 65 - 99 mg/dL 108(H) 85  BUN 6 - 24 mg/dL 14 15  Creatinine 0.76 - 1.27 mg/dL 1.17 1.10  Sodium 134 - 144 mmol/L 140 139  Potassium 3.5 - 5.2 mmol/L 5.0 5.1  Chloride 96 - 106  mmol/L 102 99  CO2 20 - 29 mmol/L 25 27  Calcium 8.7 - 10.2 mg/dL 9.5 9.3  Total Protein 6.0 - 8.5 g/dL 7.1 -  Total Bilirubin 0.0 - 1.2 mg/dL 0.3 -  Alkaline Phos 48 - 121 IU/L 88 -  AST 0 - 40 IU/L 42(H) -  ALT 0 - 44 IU/L 54(H) -   CBC Latest Ref Rng & Units 05/01/2020  WBC 3.4 - 10.8 x10E3/uL 3.5  Hemoglobin 13.0 - 17.7 g/dL 16.7  Hematocrit 37.5 - 51.0 % 46.4  Platelets 150 - 450 x10E3/uL 231    Lipid Panel Recent Labs    05/01/20 0815 07/18/20 0832 12/01/20 0820  CHOL 329* 224* 213*  TRIG 241* 198* 163*  LDLCALC 231* 134* 134*  HDL 49 55 50    HEMOGLOBIN A1C No results found for: HGBA1C, MPG TSH Recent Labs    05/01/20 0816  TSH 0.924    Medications and allergies  No Known Allergies   Current Outpatient Medications  Medication Instructions  . amLODipine-valsartan (EXFORGE)  10-320 MG tablet 1 tablet, Oral, Every evening  . atorvastatin (LIPITOR) 80 mg, Oral, Daily  . ezetimibe (ZETIA) 10 mg, Oral, Daily after supper  . omeprazole (PRILOSEC) 20 MG capsule 1 capsule, Oral, Daily   Radiology:   No results found.  Cardiac Studies:   Echocardiogram 05/05/2020:  Left ventricle cavity is normal in size. Mild concentric hypertrophy of the left ventricle. Mild global hypokinesis. LVEF 45-50%. Normal global wall motion. Normal diastolic filling pattern.  The aortic root is mildly dilated at 3.9 cm.  Trileaflet aortic valve. Mild (Grade I) aortic regurgitation.  Moderate pulmonic regurgitation.  Normal right atrial pressure.    Exercise Sestamibi stress test 06/11/2020: Exercise nuclear stress test was performed using Bruce protocol. Patient reached 14 METS, and 104% of age predicted maximum heart rate. Exercise capacity was excellent. Chest pain not reported. Heart rate and hemodynamic response were normal. Stress EKG revealed no ischemic changes. Mild decrease in tracer uptake in inferior myocardium most likely due to tissue attenuation.  Stress LVEF 51%.  Low risk study.  Coronary calcium score 06/25/2020: 1. LAD and LEFT circumflex coronary artery calcification. 2. Total Agatston Score: 30.8 3. MESA age and sex matched database percentile: 14   EKG  EKG 12/03/2020: Normal sinus rhythm at rate of 68 bpm, normal axis.  No evidence of ischemia, normal EKG.   No significant change from EKG 04/18/2020   Assessment     ICD-10-CM   1. Hypercholesteremia  E78.00 atorvastatin (LIPITOR) 80 MG tablet    ezetimibe (ZETIA) 10 MG tablet    Lipid Panel With LDL/HDL Ratio    Lipid Panel With LDL/HDL Ratio  2. Primary hypertension  I10 EKG 12-Lead    amLODipine-valsartan (EXFORGE) 10-320 MG tablet    CMP14+EGFR    CMP14+EGFR     Medications Discontinued During This Encounter  Medication Reason  . amLODipine-valsartan (EXFORGE) 5-160 MG tablet Dose change  .  atorvastatin (LIPITOR) 80 MG tablet Reorder    Recommendations:   Julian Coffey  "Julian Coffey" is a 50 y.o. 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 He has specific complaints today, tolerating Lipitor and also valsartan/amlodipine combination.  I have again reviewed the results of the previously performed test including coronary CT calcium  scoring and echocardiogram.  We discussed regarding primary prevention, hypertension leading to cardiomyopathy, heart failure, increased risk of CAD and stroke discussed with the patient.  Will increase amlodipine/valsartan combination from 5/160 mg to 10/320 mg daily.  I reviewed his lipid profile, markedly elevated cholesterol suggest familial hyperlipidemia.  With Lipitor 80 mg, clearly there is improvement in his lipid profile but in view of elevated coronary calcium score and which has placed him at [facility], would like to achieve LDL goal at least to <100 mg.  Will add Zetia 10 mg in the evening.  He would like to see me back in 6 months and he would like to see whether with additional weight loss and exercise his numbers would improve as he would like to come off of Zetia.  I will make an appointment for that.  He otherwise continues to exercise regularly, looks fit, do not think he needs a repeat echocardiogram.  May consider this after his next office visit to make sure that the LVEF is stable.  This was a 40-minute encounter.  I will repeat CMP and lipid profile testing prior to his next office visit in 6 months.  Previously also has had mild abnormal LFTs, suspect fatty liver.   Adrian Prows, PA-C 12/03/2020, 8:38 PM Office: [phone]

## 2021-05-01 ENCOUNTER — Ambulatory Visit: Payer: 59 | Admitting: Cardiology

## 2021-05-27 ENCOUNTER — Other Ambulatory Visit: Payer: Self-pay | Admitting: Student

## 2021-05-27 DIAGNOSIS — I10 Essential (primary) hypertension: Secondary | ICD-10-CM

## 2021-06-03 ENCOUNTER — Ambulatory Visit: Payer: 59 | Admitting: Cardiology

## 2021-08-25 ENCOUNTER — Other Ambulatory Visit: Payer: Self-pay

## 2021-08-25 ENCOUNTER — Other Ambulatory Visit: Payer: Self-pay | Admitting: Cardiology

## 2021-08-25 DIAGNOSIS — I10 Essential (primary) hypertension: Secondary | ICD-10-CM

## 2021-08-25 MED ORDER — AMLODIPINE BESYLATE-VALSARTAN 10-320 MG PO TABS: 1.0000 | ORAL_TABLET | Freq: Every evening | ORAL | 0 refills | Status: DC

## 2021-09-28 ENCOUNTER — Other Ambulatory Visit: Payer: Self-pay

## 2021-09-28 ENCOUNTER — Encounter: Payer: Self-pay | Admitting: Cardiology

## 2021-09-28 ENCOUNTER — Ambulatory Visit: Payer: Managed Care, Other (non HMO) | Admitting: Cardiology

## 2021-09-28 VITALS — BP 145/96 | HR 71 | Temp 97.8°F | Resp 16 | Ht 70.0 in | Wt 215.0 lb

## 2021-09-28 DIAGNOSIS — I351 Nonrheumatic aortic (valve) insufficiency: Secondary | ICD-10-CM

## 2021-09-28 DIAGNOSIS — R931 Abnormal findings on diagnostic imaging of heart and coronary circulation: Secondary | ICD-10-CM

## 2021-09-28 DIAGNOSIS — I10 Essential (primary) hypertension: Secondary | ICD-10-CM

## 2021-09-28 DIAGNOSIS — E7800 Pure hypercholesterolemia, unspecified: Secondary | ICD-10-CM

## 2021-09-28 MED ORDER — ATORVASTATIN CALCIUM 10 MG PO TABS: 10.0000 mg | ORAL_TABLET | Freq: Every day | ORAL | 3 refills | Status: DC

## 2021-09-28 MED ORDER — REPATHA SURECLICK 140 MG/ML ~~LOC~~ SOAJ: 1.0000 "pen " | SUBCUTANEOUS | 4 refills | Status: DC

## 2021-09-28 NOTE — Progress Notes (Signed)
Primary Physician/Referring:  Patient, No Pcp Per (Inactive)  Patient ID: Julian Coffey, male    DOB: [birth year], 51 y.o.   MRN: [id number]  Chief Complaint  Patient presents with   Hypertension   Hyperlipidemia   Follow-up    6 month   HPI:    Julian Coffey  "Julian Coffey" is a 51 y.o. Caucasian male with hypertension, marked hyperlipidemia suggestive of familial hyperlipidemia, coronary calcium score of 30.8 placing him in 75th percentile for his age and sex matched individual, presents here for follow-up at 6 months for hypertension hyperlipidemia.  Is presently asymptomatic, continues to exercise regularly and eats healthy.  He discontinued taking atorvastatin as he started developing myalgias and marked fatigue.  No chest pain or dyspnea.   Past Medical History:  Diagnosis Date   Hypertension    Past Surgical History:  Procedure Laterality Date   SHOULDER SURGERY Bilateral    TONSILLECTOMY     Family History  Problem Relation Age of Onset   Hypertension Mother    Hypertension Father     Social History   Tobacco Use   Smoking status: Never   Smokeless tobacco: Former    Types: Snuff    Quit date: 2014  Substance Use Topics   Alcohol use: Yes    Comment: occ   Marital Status: Married   ROS  Review of Systems  Eyes:  Negative for visual disturbance.  Cardiovascular:  Negative for chest pain, claudication, dyspnea on exertion, leg swelling, palpitations and syncope.  Respiratory:  Positive for snoring (after alcohol consumption ).   Gastrointestinal:  Negative for melena.  Neurological:  Negative for dizziness and headaches.  Objective  Blood pressure (!) 145/96, pulse 71, temperature 97.8 F (36.6 C), resp. rate 16, height 5\' 10"  (1.778 m), weight 215 lb (97.5 kg), SpO2 96 %.  Vitals with BMI 09/28/2021 09/28/2021 09/28/2021  Height - - 5\' 10"   Weight - - 215 lbs  BMI - - [id number]  Systolic [id number] 123456 123456  Diastolic 96 123456 [id number]  Pulse 71 75 75     Physical  Exam Constitutional:      General: He is not in acute distress.    Appearance: He is obese. He is not ill-appearing.  Cardiovascular:     Rate and Rhythm: Normal rate and regular rhythm.     Pulses: Intact distal pulses.     Heart sounds: Normal heart sounds. No murmur heard.   No gallop.     Comments: No leg edema, no JVD. Pulmonary:     Effort: Pulmonary effort is normal.     Breath sounds: Normal breath sounds.  Neurological:     Mental Status: He is alert.   Laboratory examination:   No results for input(s): NA, K, CL, CO2, GLUCOSE, BUN, CREATININE, CALCIUM, GFRNONAA, GFRAA in the last 8760 hours.  CrCl cannot be calculated (Patient's most recent lab result is older than the maximum 21 days allowed.).  CMP Latest Ref Rng & Units 05/01/2020 04/18/2020  Glucose 65 - 99 mg/dL 108(H) 85  BUN 6 - 24 mg/dL 14 15  Creatinine 0.76 - 1.27 mg/dL 1.17 1.10  Sodium 134 - 144 mmol/L 140 139  Potassium 3.5 - 5.2 mmol/L 5.0 5.1  Chloride 96 - 106 mmol/L 102 99  CO2 20 - 29 mmol/L 25 27  Calcium 8.7 - 10.2 mg/dL 9.5 9.3  Total Protein 6.0 - 8.5 g/dL 7.1 -  Total Bilirubin 0.0 - 1.2 mg/dL 0.3 -  Alkaline  Phos 48 - 121 IU/L 88 -  AST 0 - 40 IU/L 42(H) -  ALT 0 - 44 IU/L 54(H) -   CBC Latest Ref Rng & Units 05/01/2020  WBC 3.4 - 10.8 x10E3/uL 3.5  Hemoglobin 13.0 - 17.7 g/dL 02.4  Hematocrit 09.7 - 51.0 % 46.4  Platelets 150 - 450 x10E3/uL 231    Lipid Panel Recent Labs    12/01/20 0820  CHOL 213*  TRIG 163*  LDLCALC 134*  HDL 50   Lipid Panel     Component Value Date/Time   CHOL 213 (H) 12/01/2020 0820   TRIG 163 (H) 12/01/2020 0820   HDL 50 12/01/2020 0820   LDLCALC 134 (H) 12/01/2020 0820   LABVLDL 29 12/01/2020 0820    HEMOGLOBIN A1C No results found for: HGBA1C, MPG TSH No results for input(s): TSH in the last 8760 hours.   Medications and allergies  No Known Allergies   Current Outpatient Medications  Medication Instructions   amLODipine-valsartan (EXFORGE)  10-320 MG tablet 1 tablet, Oral, Every evening   atorvastatin (LIPITOR) 10 mg, Oral, Daily   Evolocumab (REPATHA SURECLICK) 140 MG/ML SOAJ 1 pen, Subcutaneous, Every 14 days   omeprazole (PRILOSEC) 20 MG capsule 1 capsule, Oral, Daily   Radiology:   No results found.  Cardiac Studies:   Echocardiogram 05/05/2020:  Left ventricle cavity is normal in size. Mild concentric hypertrophy of the left ventricle. Mild global hypokinesis. LVEF 45-50%. Normal global wall motion. Normal diastolic filling pattern.  The aortic root is mildly dilated at 3.9 cm.  Trileaflet aortic valve.  Mild (Grade I) aortic regurgitation.  Moderate pulmonic regurgitation.  Normal right atrial pressure.    Exercise Sestamibi stress test 06/11/2020: Exercise nuclear stress test was performed using Bruce protocol. Patient reached 14 METS, and 104% of age predicted maximum heart rate. Exercise capacity was excellent. Chest pain not reported. Heart rate and hemodynamic response were normal. Stress EKG revealed no ischemic changes. Mild decrease in tracer uptake in inferior myocardium most likely due to tissue attenuation.  Stress LVEF 51%.  Low risk study.  Coronary calcium score 06/25/2020: 1. LAD and LEFT circumflex coronary artery calcification. 2. Total Agatston Score: 30.8 3. MESA age and sex matched database percentile: 75   EKG  EKG 09/28/2021: Normal sinus rhythm at rate of 80 bpm, left atrial enlargement, poor R wave progression, probably normal variant.  No evidence of ischemia.  No significant change from 12/03/2020.  Assessment     ICD-10-CM   1. Elevated coronary artery calcium score 75th percentile 09/26/2019  R93.1 Evolocumab (REPATHA SURECLICK) 140 MG/ML SOAJ    atorvastatin (LIPITOR) 10 MG tablet    2. Primary hypertension  I10 EKG 12-Lead    PSA    Ambulatory referral to Internal Medicine    PCV ECHOCARDIOGRAM COMPLETE    3. Hypercholesteremia  E78.00 Evolocumab (REPATHA SURECLICK) 140 MG/ML  SOAJ    atorvastatin (LIPITOR) 10 MG tablet    Ambulatory referral to Internal Medicine    4. Nonrheumatic aortic valve insufficiency  I35.1 PCV ECHOCARDIOGRAM COMPLETE    5. White coat syndrome with diagnosis of hypertension  I10        Medications Discontinued During This Encounter  Medication Reason   ezetimibe (ZETIA) 10 MG tablet    atorvastatin (LIPITOR) 80 MG tablet     Recommendations:   Rikki Spearing  "Julian Coffey" is a 51 y.o. Caucasian male with hypertension, marked hyperlipidemia suggestive of familial hyperlipidemia, coronary calcium score of 30.8 placing him  in 75th percentile for his age and sex matched individual, presents here for follow-up at 6 months for hypertension hyperlipidemia. He has specific complaints today, tolerating Lipitor and also valsartan/amlodipine combination.  He was on 80 mg of Lipitor as his LDL was markedly elevated at >220 21.  LDL is reduced to less than 140 with 80 mg however he developed severe myalgias and fatigue hence discontinued this.  We will repeat his lipid profile testing today, I suspect he has familial hyperlipidemia as he continues to exercise and eats very healthy and yet his lipids are markedly elevated.  I will repeat lipid profile testing today, along with CMP as his LFTs were mildly elevated previously felt to be due to fatty liver.  Patient is interested in switching to [place], advised him that he should still continue with Lipitor at least 10 mg daily.  I will send for Rx Repatha and do prior authorization.  Blood pressure is elevated today but patient states that his blood pressure at home is been very well controlled around 120/70 mmHg or so.  He will bring his blood pressure cuff on his next office visit in 4 weeks.  Will repeat echocardiogram to evaluate for and follow-up on aortic regurgitation and also aortic root dilatation along with follow-up of hypertension.  This was a 40-minute office visit encounter as patient has  questions regarding lipid therapy in a patient who is very active and exercises and eats healthy, risks of CV events, elevated coronary calcium score in the 75th percentile also discussed with the patient.  He needs a PCP, will refer to Dr. Okey Dupre to establish care for management of hypertension and hyperlipidemia as well.  We will check PSA.  Patient is scheduled for colonoscopy and he has made this appointment himself.   Adrian Prows, PA-C 09/28/2021, 9:16 AM Office: [phone]

## 2021-09-29 LAB — PSA: Prostate Specific Ag, Serum: 1 ng/mL (ref 0.0–4.0)

## 2021-09-30 ENCOUNTER — Ambulatory Visit: Payer: Managed Care, Other (non HMO)

## 2021-09-30 ENCOUNTER — Other Ambulatory Visit: Payer: Self-pay

## 2021-09-30 DIAGNOSIS — I351 Nonrheumatic aortic (valve) insufficiency: Secondary | ICD-10-CM

## 2021-09-30 DIAGNOSIS — I10 Essential (primary) hypertension: Secondary | ICD-10-CM

## 2021-10-06 ENCOUNTER — Telehealth: Payer: Self-pay | Admitting: Cardiology

## 2021-10-06 ENCOUNTER — Telehealth: Payer: Self-pay

## 2021-10-06 DIAGNOSIS — E7800 Pure hypercholesterolemia, unspecified: Secondary | ICD-10-CM

## 2021-10-06 DIAGNOSIS — R931 Abnormal findings on diagnostic imaging of heart and coronary circulation: Secondary | ICD-10-CM

## 2021-10-06 MED ORDER — EZETIMIBE 10 MG PO TABS: 10.0000 mg | ORAL_TABLET | Freq: Every day | ORAL | 3 refills | Status: DC

## 2021-10-06 MED ORDER — ATORVASTATIN CALCIUM 20 MG PO TABS: 20.0000 mg | ORAL_TABLET | Freq: Every day | ORAL | 3 refills | Status: DC

## 2021-10-06 NOTE — Telephone Encounter (Signed)
Patients Repatha has been denied. Appeal was also denied

## 2021-10-06 NOTE — Telephone Encounter (Signed)
Discussed with the patient regarding denial from the insurance regarding Repatha, patient previously was on 80 mg of Lipitor and did not tolerate it.  I do not think 10 mg of Lipitor will make a change in his lipids, will increase it to 20 mg and add Zetia 10 mg, will recheck lipids in about 2 to 3 months.  Lab orders have been placed.  With regard to echocardiogram, discussed with him regarding improvement in LVEF from 45 to 50% to the present normal range.  Blood pressure is now well controlled.    ICD-10-CM   1. Hypercholesteremia  E78.00 atorvastatin (LIPITOR) 20 MG tablet    ezetimibe (ZETIA) 10 MG tablet    2. Elevated coronary artery calcium score 75th percentile 09/26/2019  R93.1 atorvastatin (LIPITOR) 20 MG tablet    ezetimibe (ZETIA) 10 MG tablet     Meds ordered this encounter  Medications   atorvastatin (LIPITOR) 20 MG tablet    Sig: Take 1 tablet (20 mg total) by mouth daily.    Dispense:  90 tablet    Refill:  3    Cancel 10 mg Rx   ezetimibe (ZETIA) 10 MG tablet    Sig: Take 1 tablet (10 mg total) by mouth daily after supper.    Dispense:  90 tablet    Refill:  3

## 2021-10-07 NOTE — Telephone Encounter (Signed)
Rescheduled his appointment to 1 year

## 2021-10-26 ENCOUNTER — Ambulatory Visit: Payer: Managed Care, Other (non HMO) | Admitting: Cardiology

## 2022-03-01 ENCOUNTER — Other Ambulatory Visit: Payer: Self-pay | Admitting: Cardiology

## 2022-03-01 DIAGNOSIS — I10 Essential (primary) hypertension: Secondary | ICD-10-CM

## 2022-04-01 IMAGING — CT CT CARDIAC CORONARY ARTERY CALCIUM SCORE
3 series · 14 of 20 positions shown, 16 images · non-contrast
Comparison: None.

CLINICAL DATA: 49-year-old white male with elevated cholesterol.

EXAM:
CT CARDIAC CORONARY ARTERY CALCIUM SCORE
TECHNIQUE: Non-contrast imaging through the heart was performed using
prospective ECG gating. Image post processing was performed on an
independent workstation, allowing for quantitative analysis of the
heart and coronary arteries. Note that this exam targets the heart
and the chest was not imaged in its entirety.

[Series 2: calcium scoring 2.00 qr36 bestdiast 70% hrt calciu · axial · 0.39mm/px · z∈[+1651,+1729]mm · 4 of 66 slices shown]
[im 14/66  vessel]
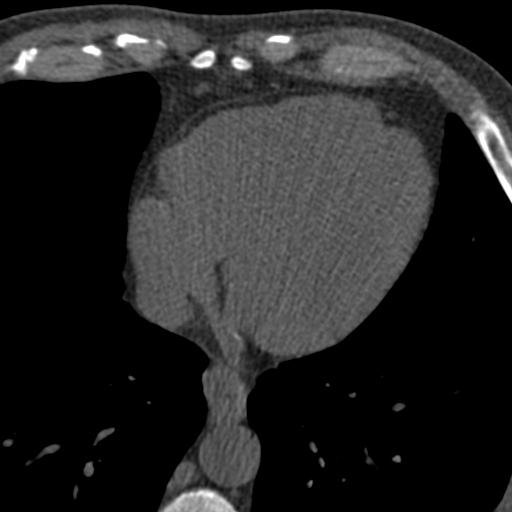
[im 27/66  vessel]
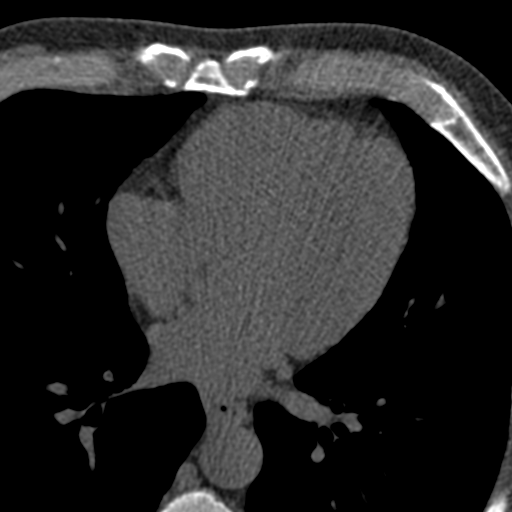
[im 40/66  vessel]
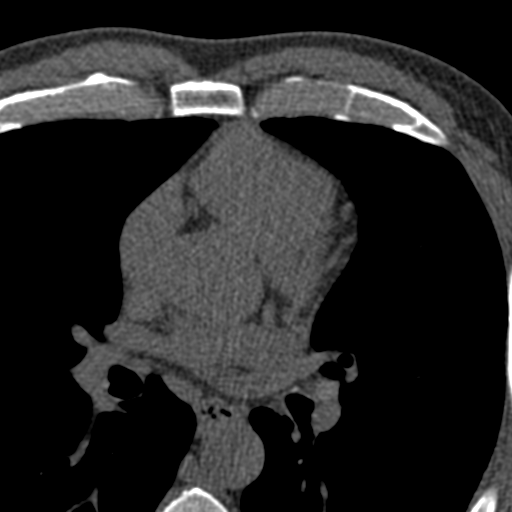
[im 53/66  vessel]
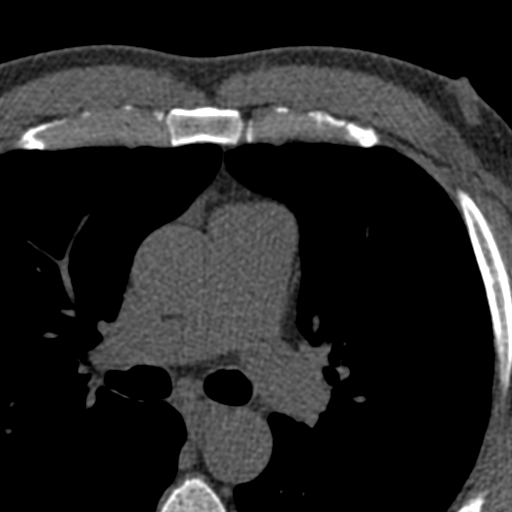

[Series 3: calcium scoring 2.00 br40 bestdiast 70% axial · axial · 0.62mm/px · z∈[+1645,+1733]mm · 5 of 66 slices shown, 7 images]
[im 11/66  vessel]
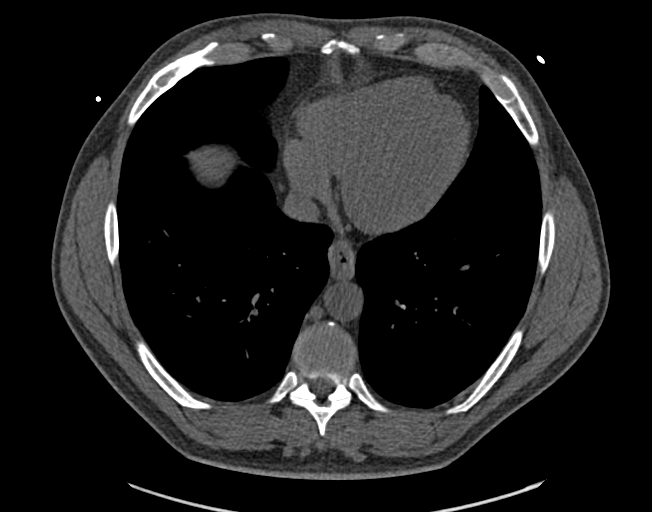
[im 11/66  lung]
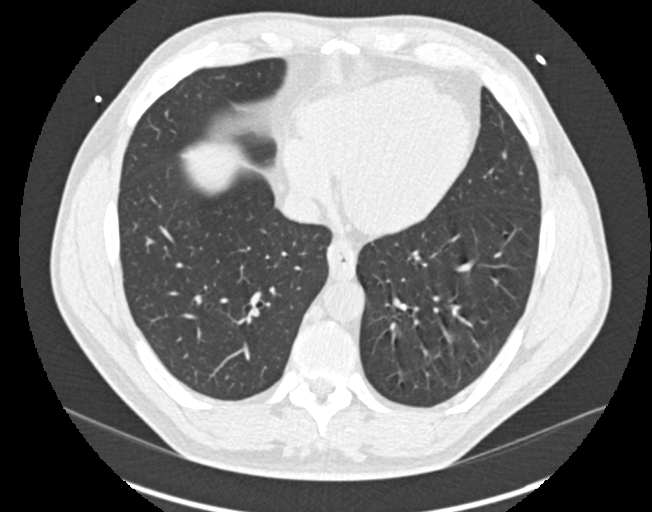
[im 22/66  vessel]
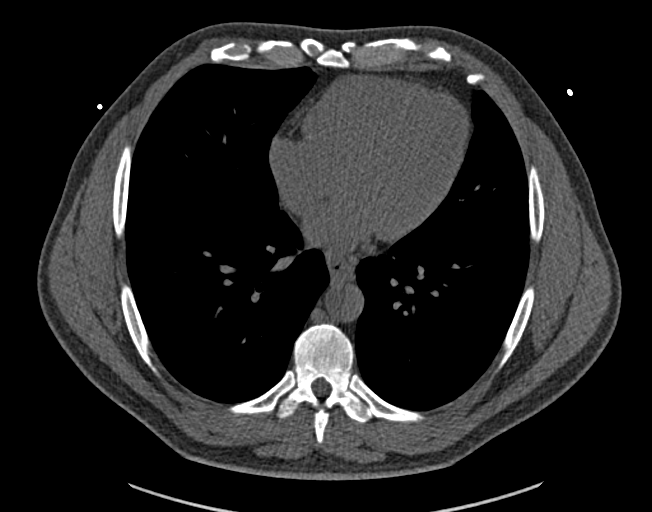
[im 33/66  vessel]
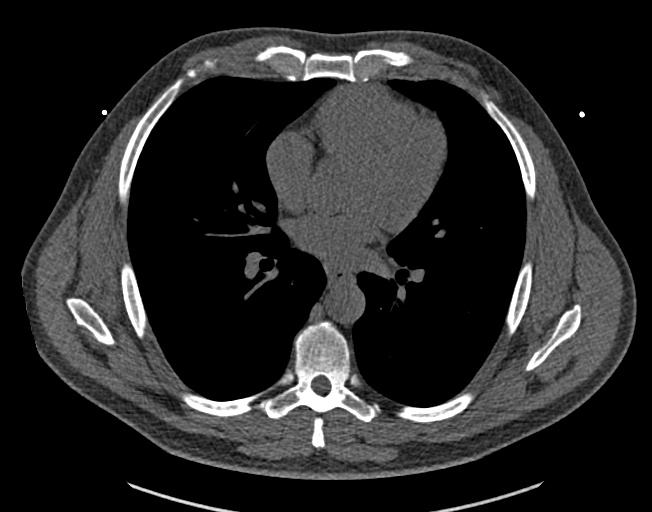
[im 44/66  vessel]
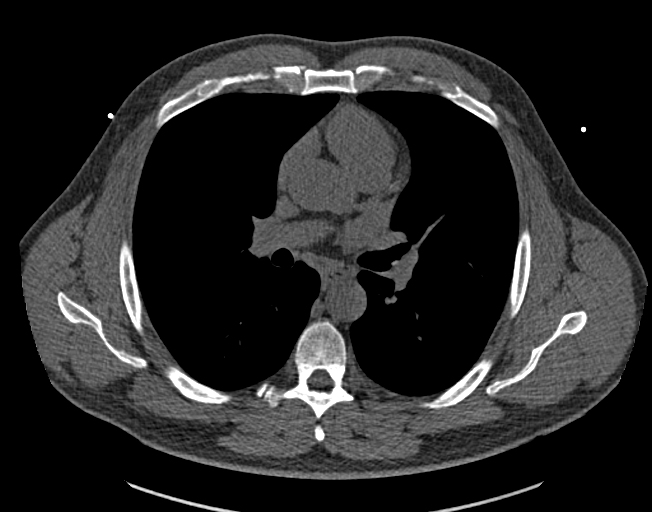
[im 55/66  vessel]
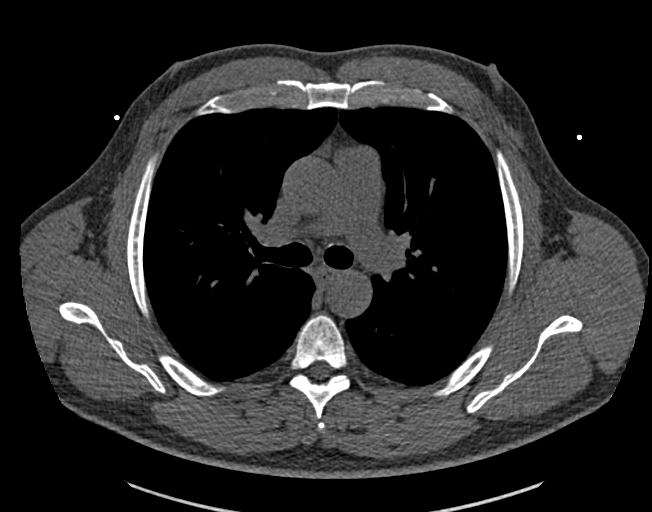
[im 55/66  lung]
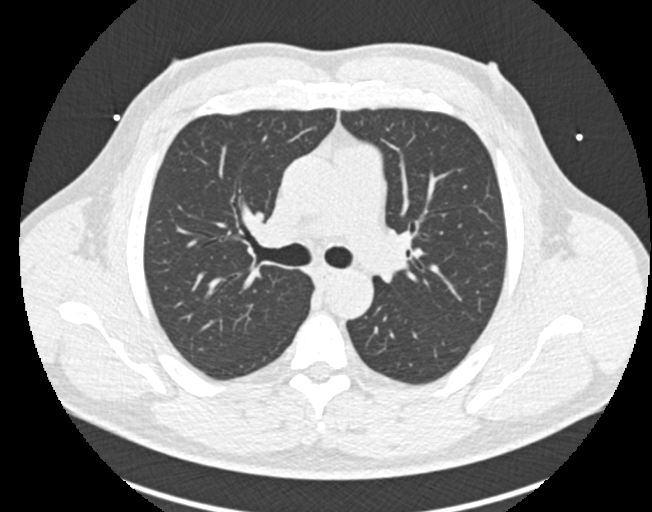

[Series 9: calcium scoring 2.00 br60 bestdiast 70% lungs · axial · 0.62mm/px · z∈[+1645,+1733]mm · 5 of 66 slices shown]
[im 11/66  vessel]
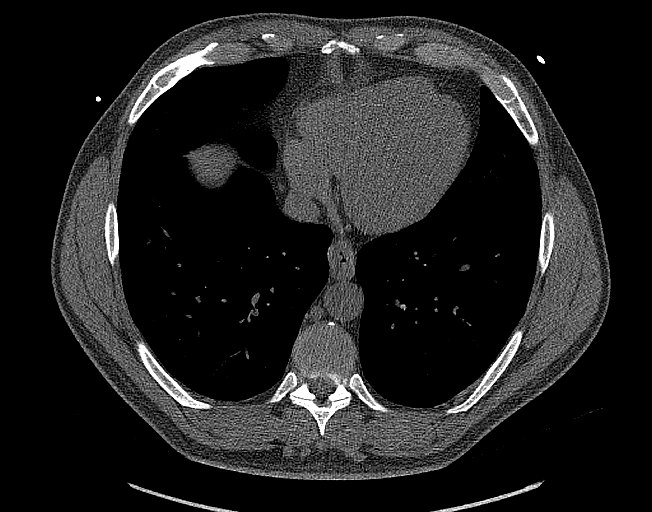
[im 22/66  vessel]
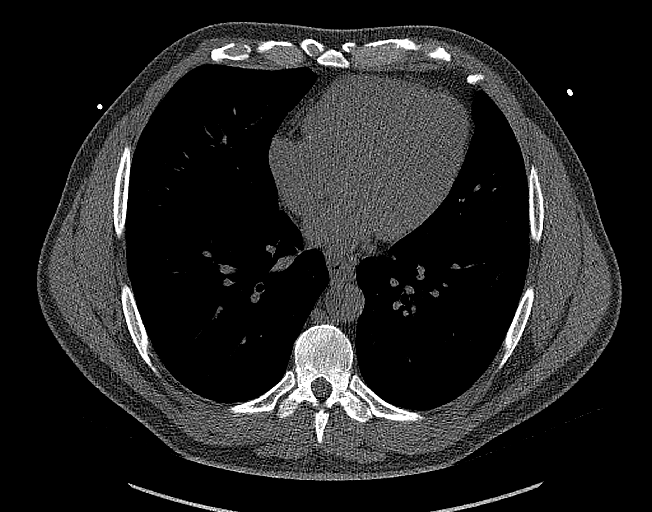
[im 33/66  vessel]
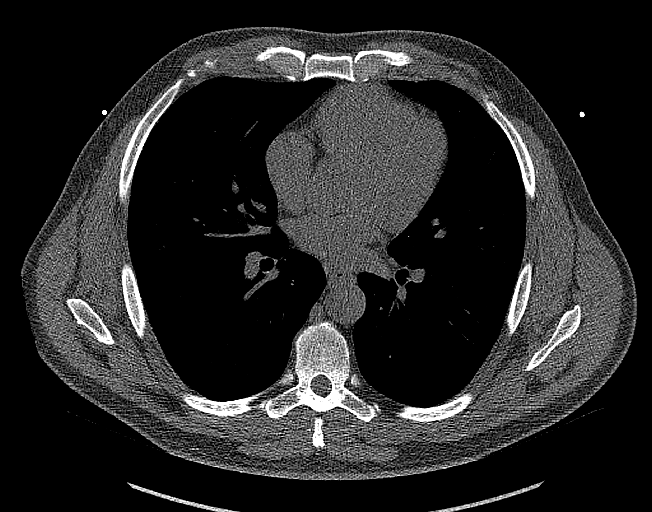
[im 44/66  vessel]
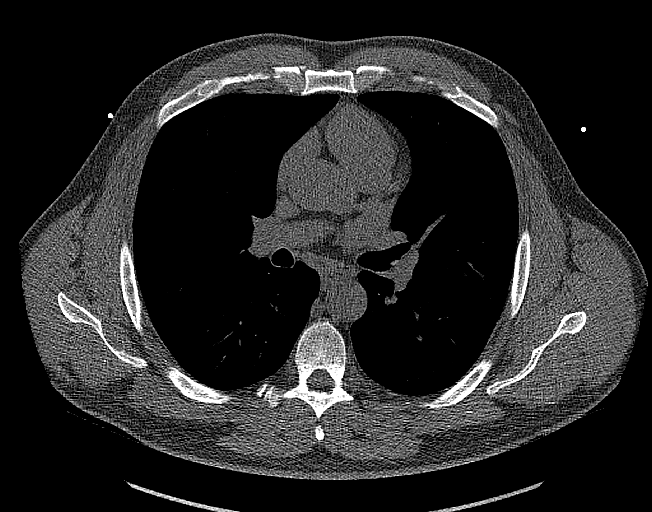
[im 55/66  vessel]
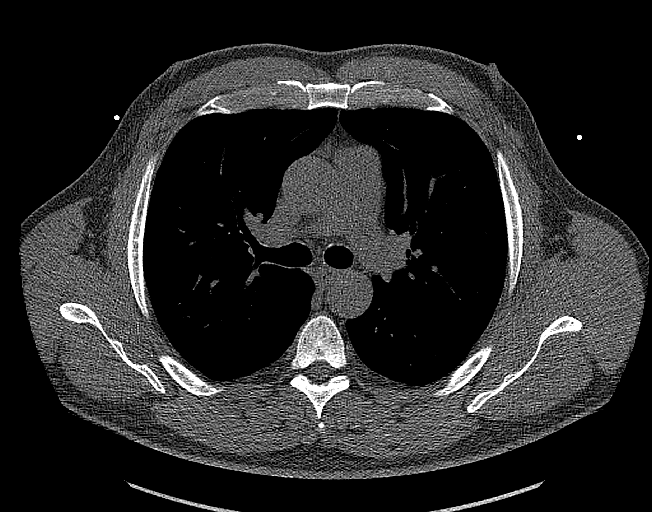

[14 of 20 positions shown; findings below may reference images not displayed]

FINDINGS: Technical quality: Good

CORONARY CALCIUM SCORES:

Left Main: No coronary artery calcification

LAD:

LCx:

RCA: No coronary calcification

CORONARY CALCIUM

Total Agatston Score:

[HOSPITAL] percentile: 75

Ascending aorta (normal <  40 mm): 32 mm

EXTRACARDIAC FINDINGS:

Limited view of the lung parenchyma demonstrates no suspicious
nodularity. Airways are normal.

Limited view of the mediastinum demonstrates upper limits normal
RIGHT lower paratracheal lymph node 10 mm. Esophagus normal.

Limited view of the upper abdomen unremarkable.

Limited view of the skeleton and chest wall is unremarkable.
IMPRESSION: 1. LAD and LEFT circumflex coronary artery calcification.

2. Total Agatston Score:

3. MESA age and sex matched database percentile: 75

## 2022-04-22 ENCOUNTER — Ambulatory Visit: Payer: Managed Care, Other (non HMO) | Admitting: Podiatry

## 2022-04-22 ENCOUNTER — Encounter: Payer: Self-pay | Admitting: Podiatry

## 2022-04-22 ENCOUNTER — Ambulatory Visit (INDEPENDENT_AMBULATORY_CARE_PROVIDER_SITE_OTHER): Payer: Managed Care, Other (non HMO)

## 2022-04-22 DIAGNOSIS — G8929 Other chronic pain: Secondary | ICD-10-CM | POA: Diagnosis not present

## 2022-04-22 DIAGNOSIS — M79671 Pain in right foot: Secondary | ICD-10-CM | POA: Diagnosis not present

## 2022-04-22 DIAGNOSIS — M722 Plantar fascial fibromatosis: Secondary | ICD-10-CM | POA: Diagnosis not present

## 2022-04-22 MED ORDER — MELOXICAM 15 MG PO TABS: 15.0000 mg | ORAL_TABLET | Freq: Every day | ORAL | 0 refills | Status: DC

## 2022-04-22 MED ORDER — DEXAMETHASONE SODIUM PHOSPHATE 120 MG/30ML IJ SOLN
4.0000 mg | Freq: Once | INTRAMUSCULAR | Status: AC
  Administered 2022-04-22: 4 mg via INTRA_ARTICULAR

## 2022-04-22 NOTE — Patient Instructions (Signed)

## 2022-04-22 NOTE — Progress Notes (Signed)
  Subjective:  Patient ID: Julian Coffey, male    DOB: 12/22/1970,   MRN: [id number]  Chief Complaint  Patient presents with   Foot Pain    Right heel pain , patient states he is not a diabetic ,constant pain patient states his pain is all of the above and nothing helps relieve it    51 y.o. male presents for concern of right heel pain that has been going on for several weeks. Relates constant pain that aches. Hurts when walking and it is mostly in the heel.  Has been stretching and doing cupping with no relief. Not tried any anti-inflammatories.  Denies any other pedal complaints. Denies n/v/f/c.   Past Medical History:  Diagnosis Date   Hypertension     Objective:  Physical Exam: Vascular: DP/PT pulses 2/4 bilateral. CFT <3 seconds. Normal hair growth on digits. No edema.  Skin. No lacerations or abrasions bilateral feet.  Musculoskeletal: MMT 5/5 bilateral lower extremities in DF, PF, Inversion and Eversion. Deceased ROM in DF of ankle joint. Tender to medial calcaneal tubercle on the right exquisitely. No pain along arch achilles or PT. No pain with calcaneal squeeze.  Neurological: Sensation intact to light touch.   Assessment:   1. Plantar fasciitis of right foot      Plan:  Patient was evaluated and treated and all questions answered. Discussed plantar fasciitis with patient.  X-rays reviewed and discussed with patient. No acute fractures or dislocations noted. Mild spurring noted at inferior calcaneus.  Discussed treatment options including, ice, NSAIDS, supportive shoes, bracing, and stretching. Stretching exercises provided to be done on a daily basis.   Prescription for meloxicam provided and sent to pharmacy. Patient requesting injection today. Procedure note below.   Night splint dispensed.  Follow-up 6 weeks or sooner if any problems arise. In the meantime, encouraged to call the office with any questions, concerns, change in symptoms.   Procedure:  Discussed  etiology, pathology, conservative vs. surgical therapies. At this time a plantar fascial injection was recommended.  The patient agreed and a sterile skin prep was applied.  An injection consisting of  dexamethasone and marcaine mixture was infiltrated at the point of maximal tenderness on the right Heel.  Bandaid applied. The patient tolerated this well and was given instructions for aftercare.    Louann Sjogren, DPM

## 2022-06-04 ENCOUNTER — Ambulatory Visit: Payer: Managed Care, Other (non HMO) | Admitting: Podiatry

## 2022-06-10 ENCOUNTER — Ambulatory Visit: Payer: Managed Care, Other (non HMO) | Admitting: Podiatry

## 2022-06-10 DIAGNOSIS — M722 Plantar fascial fibromatosis: Secondary | ICD-10-CM | POA: Diagnosis not present

## 2022-06-10 NOTE — Progress Notes (Signed)
  Subjective:  Patient ID: Julian Coffey, male    DOB: Nov 20, 1970,   MRN: [id number]  Chief Complaint  Patient presents with   Foot Injury    Patient states foot is still getting better , states he feel like his pain has moved to both sides of heel. Pain is no longer constant it varies     51 y.o. male presents for  follow-up of right heel pain.. Relates he is doing better and has improved over 50% but not quite 100%. Relates injection did help. He states he has been stretching and using the night splint. Stopped taking the meloxicam as did not think it helped. 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  Denies any other pedal complaints. Denies n/v/f/c.   Past Medical History:  Diagnosis Date   Hypertension     Objective:  Physical Exam: Vascular: DP/PT pulses 2/4 bilateral. CFT <3 seconds. Normal hair growth on digits. No edema.  Skin. No lacerations or abrasions bilateral feet.  Musculoskeletal: MMT 5/5 bilateral lower extremities in DF, PF, Inversion and Eversion. Deceased ROM in DF of ankle joint. Tender to medial calcaneal tubercle on the right . No pain along arch achilles or PT. No pain with calcaneal squeeze.  Neurological: Sensation intact to light touch.   Assessment:   1. Plantar fasciitis of right foot       Plan:  Patient was evaluated and treated and all questions answered. Discussed plantar fasciitis with patient.  X-rays reviewed and discussed with patient. No acute fractures or dislocations noted. Mild spurring noted at inferior calcaneus.  Discussed treatment options including, ice, NSAIDS, supportive shoes, bracing, and stretching.  Continue stretching and night splint. May return to activity but advised to stretch really well and continue to wear support.  Injection deferred today.  Continue night splint Follow-up as needed    Lorenda Peck, DPM

## 2022-06-23 ENCOUNTER — Encounter: Payer: Self-pay | Admitting: Cardiology

## 2022-06-23 ENCOUNTER — Ambulatory Visit: Payer: Managed Care, Other (non HMO) | Admitting: Cardiology

## 2022-06-23 VITALS — BP 122/80 | HR 79 | Temp 97.9°F | Resp 16 | Ht 70.0 in | Wt 217.4 lb

## 2022-06-23 DIAGNOSIS — R931 Abnormal findings on diagnostic imaging of heart and coronary circulation: Secondary | ICD-10-CM

## 2022-06-23 DIAGNOSIS — I10 Essential (primary) hypertension: Secondary | ICD-10-CM

## 2022-06-23 DIAGNOSIS — E7801 Familial hypercholesterolemia: Secondary | ICD-10-CM

## 2022-06-23 DIAGNOSIS — Z125 Encounter for screening for malignant neoplasm of prostate: Secondary | ICD-10-CM

## 2022-06-23 NOTE — Progress Notes (Addendum)
Primary Physician/Referring:  Pcp, No  Patient ID: [name], male    DOB: December 11, 1970, 51 y.o.   MRN: [id number]  Chief Complaint  Patient presents with   Hypertension   Hyperlipidemia   Elevated calcium score   Follow-up    6 months   HPI:    Julian Coffey  "Julian Coffey" is a 51 y.o. Caucasian male with hypertension, marked hyperlipidemia suggestive of familial hyperlipidemia, coronary calcium score of 30.8 placing him in 75th percentile for his age and sex matched individual, presents here for follow-up at 6 months for hypertension hyperlipidemia.   He is presently asymptomatic, continues to exercise regularly and eats healthy.  He discontinued taking atorvastatin as he started developing myalgias and marked fatigue.   Past Medical History:  Diagnosis Date   Hypertension    Past Surgical History:  Procedure Laterality Date   SHOULDER SURGERY Bilateral    TONSILLECTOMY     Family History  Problem Relation Age of Onset   Hypertension Mother    Hypertension Father     Social History   Tobacco Use   Smoking status: Never   Smokeless tobacco: Former    Types: Snuff    Quit date: 2014  Substance Use Topics   Alcohol use: Yes    Comment: occasionally   Marital Status: Married   ROS  Review of Systems  Cardiovascular:  Negative for chest pain, dyspnea on exertion and leg swelling.   Objective  Blood pressure 122/80, pulse 79, temperature 97.9 F (36.6 C), temperature source Temporal, resp. rate 16, height 5\' 10"  (1.778 m), weight 217 lb 6.4 oz (98.6 kg), SpO2 97 %.     06/23/2022    9:30 AM 09/28/2021    8:35 AM 09/28/2021    8:34 AM  Vitals with BMI  Height 5\' 10"     Weight 217 lbs 6 oz    BMI 54.00    Systolic 867 619 509  Diastolic 80 96 326  Pulse 79 71 75     Physical Exam Neck:     Vascular: No carotid bruit or JVD.  Cardiovascular:     Rate and Rhythm: Normal rate and regular rhythm.     Pulses: Intact distal pulses.     Heart sounds: Normal heart  sounds. No murmur heard.    No gallop.  Pulmonary:     Effort: Pulmonary effort is normal.     Breath sounds: Normal breath sounds.  Abdominal:     General: Bowel sounds are normal.     Palpations: Abdomen is soft.  Musculoskeletal:     Right lower leg: No edema.     Left lower leg: No edema.    Laboratory examination:      Latest Ref Rng & Units 05/01/2020    8:15 AM 04/18/2020    4:36 PM  CMP  Glucose 65 - 99 mg/dL 108  85   BUN 6 - 24 mg/dL 14  15   Creatinine 0.76 - 1.27 mg/dL 1.17  1.10   Sodium 134 - 144 mmol/L 140  139   Potassium 3.5 - 5.2 mmol/L 5.0  5.1   Chloride 96 - 106 mmol/L 102  99   CO2 20 - 29 mmol/L 25  27   Calcium 8.7 - 10.2 mg/dL 9.5  9.3   Total Protein 6.0 - 8.5 g/dL 7.1    Total Bilirubin 0.0 - 1.2 mg/dL 0.3    Alkaline Phos 48 - 121 IU/L 88  AST 0 - 40 IU/L 42    ALT 0 - 44 IU/L 54        Latest Ref Rng & Units 05/01/2020    8:17 AM  CBC  WBC 3.4 - 10.8 x10E3/uL 3.5   Hemoglobin 13.0 - 17.7 g/dL 29.5   Hematocrit 62.1 - 51.0 % 46.4   Platelets 150 - 450 x10E3/uL 231     Lipid Panel Recent Labs    07/01/22 1034  CHOL 325*  TRIG 178*  LDLCALC 235*  HDL 55    Lipid Panel     Component Value Date/Time   CHOL 325 (H) 07/01/2022 1034   TRIG 178 (H) 07/01/2022 1034   HDL 55 07/01/2022 1034   LDLCALC 235 (H) 07/01/2022 1034   LABVLDL 35 07/01/2022 1034    Apolipo. B/A-1 Ratio 07/01/2022 0.0 - 0.7 ratio 1.0 High     Lipoprotein (a) <75.0 nmol/L 49.2      Lab Results  Component Value Date   TSH 0.924 05/01/2020    Medications and allergies   Allergies  Allergen Reactions   Atorvastatin Calcium Other (See Comments)    Myalgia     Current Outpatient Medications:    Evolocumab (REPATHA SURECLICK) 140 MG/ML SOAJ, Inject 140 mg into the skin every 14 (fourteen) days., Disp: 2 mL, Rfl: 3   omeprazole (PRILOSEC) 20 MG capsule, Take 1 capsule by mouth daily., Disp: , Rfl:    amLODipine-valsartan (EXFORGE) 10-320 MG tablet, TAKE  1 TABLET BY MOUTH EVERY EVENING, Disp: 90 tablet, Rfl: 0   Radiology:   No results found.  Cardiac Studies:   Exercise Sestamibi stress test 06/11/2020: Exercise nuclear stress test was performed using Bruce protocol. Patient reached 14 METS, and 104% of age predicted maximum heart rate. Exercise capacity was excellent. Chest pain not reported. Heart rate and hemodynamic response were normal. Stress EKG revealed no ischemic changes. Mild decrease in tracer uptake in inferior myocardium most likely due to tissue attenuation.  Stress LVEF 51%.  Low risk study.  Coronary calcium score 06/25/2020: 1. LAD and LEFT circumflex coronary artery calcification. 2. Total Agatston Score: 30.8 3. MESA age and sex matched database percentile: 75  PCV ECHOCARDIOGRAM COMPLETE 09/30/2021  Narrative Echocardiogram 09/30/2021: Normal LV systolic function with visual EF 60-65%. Left ventricle cavity is normal in size. Mild concentric hypertrophy of the left ventricle. Normal global wall motion. Normal diastolic filling pattern, normal LAP. Trace aortic regurgitation. Mild pulmonic regurgitation. Compared to 05/05/2020 LVEF improved from 45-50% to 60-65%, Aortic root was 3.9cm now 3.7cm, Mild AR is now trace, otherwise no significant change.    EKG  EKG 06/23/2022: Normal sinus rhythm at rate of 75 bpm, normal EKG.   Assessment     ICD-10-CM   1. Elevated coronary artery calcium score 06/25/20: Total Agatston Score: 30.8 MESA percentile 75.  R93.1 Evolocumab (REPATHA SURECLICK) 140 MG/ML SOAJ    Lipid Panel With LDL/HDL Ratio    2. Heterozygous familial hypercholesterolemia  E78.01 Lipid Panel With LDL/HDL Ratio    Lipoprotein A (LPA)    Apo A1 + B + Ratio    COMPLETE METABOLIC PANEL WITH GFR    Evolocumab (REPATHA SURECLICK) 140 MG/ML SOAJ    Lipid Panel With LDL/HDL Ratio    3. Primary hypertension  I10 EKG 12-Lead    4. Screening for prostate cancer  Z12.5 PSA       Medications  Discontinued During This Encounter  Medication Reason   atorvastatin (LIPITOR) 20 MG tablet  Evolocumab (REPATHA SURECLICK) 140 MG/ML SOAJ    meloxicam (MOBIC) 15 MG tablet    ezetimibe (ZETIA) 10 MG tablet     Recommendations:   Julian Coffey  "Julian Coffey" is a 51 y.o. Caucasian male with hypertension, marked hyperlipidemia suggestive of familial hyperlipidemia, coronary calcium score of 30.8 placing him in 75th percentile for his age and sex matched individual, presents here for follow-up at 6 months for hypertension hyperlipidemia.   He is asymptomatic.  He could not tolerate atorvastatin, has discontinued this due to myalgias.  He clearly has familial heterozygous hypercholesterolemia as his LDL was >200 in 2021 prior to starting statins.  I will recheck lipids, LPA and APO A1 plus B ratio.  His coronary calcium score is in the 75th percentile.  I am very concerned about his elevated LDL and hypertension and mild obesity as risk factors.  I encouraged him to establish with PCP, he does not want to see anyone.  I will place an order for PSA as he is >63 years of age.  Blood pressure is well controlled on present medical therapy, his blood pressure recordings have been in the 70 range diastolic at home.  Hence no changes in the medications were done today.  Weight loss was again discussed with the patient.  I would like to see him back on an annual basis.  Addendum: 09/01/2022: Labs reviewed, lipoprotein a is normal, APO A1-B ratio elevated at 1.0.  Lipids revealed total cholesterol greater than 300 and LDL greater than 200 suggests familial heterozygous hyperlipidemia.  He is in the 75th percentile with regard to coronary calcium score.  We will start him on Repatha and recheck his lipids in 4 to 6 weeks.   Yates Decamp, MD, [facility] 07/02/2022, 12:48 PM Office: [phone] Fax: [phone] Pager: [phone]

## 2022-06-30 ENCOUNTER — Other Ambulatory Visit: Payer: Self-pay | Admitting: Cardiology

## 2022-06-30 DIAGNOSIS — I10 Essential (primary) hypertension: Secondary | ICD-10-CM

## 2022-07-02 LAB — APO A1 + B + RATIO
Apolipo. B/A-1 Ratio: 1 ratio — ABNORMAL HIGH (ref 0.0–0.7)
Apolipoprotein A-1: 166 mg/dL (ref 101–178)
Apolipoprotein B: 161 mg/dL — ABNORMAL HIGH (ref <90)

## 2022-07-02 LAB — LIPID PANEL WITH LDL/HDL RATIO
Cholesterol, Total: 325 mg/dL — ABNORMAL HIGH (ref 100–199)
HDL: 55 mg/dL (ref >39)
LDL Chol Calc (NIH): 235 mg/dL — ABNORMAL HIGH (ref 0–99)
LDL/HDL Ratio: 4.3 ratio — ABNORMAL HIGH (ref 0.0–3.6)
Triglycerides: 178 mg/dL — ABNORMAL HIGH (ref 0–149)
VLDL Cholesterol Cal: 35 mg/dL (ref 5–40)

## 2022-07-02 LAB — LIPOPROTEIN A (LPA): Lipoprotein (a): 49.2 nmol/L (ref <75.0)

## 2022-07-02 LAB — PSA: Prostate Specific Ag, Serum: 1.1 ng/mL (ref 0.0–4.0)

## 2022-07-02 MED ORDER — REPATHA SURECLICK 140 MG/ML ~~LOC~~ SOAJ: 1.0000 mL | SUBCUTANEOUS | 3 refills | Status: DC

## 2022-07-02 NOTE — Addendum Note (Signed)
Addended by: Kela Millin on: 07/02/2022 12:49 PM   Modules accepted: Orders

## 2022-07-24 LAB — COMPREHENSIVE METABOLIC PANEL
ALT: 49 IU/L — ABNORMAL HIGH (ref 0–44)
AST: 40 IU/L (ref 0–40)
Albumin/Globulin Ratio: 2 (ref 1.2–2.2)
Albumin: 4.9 g/dL (ref 3.8–4.9)
Alkaline Phosphatase: 88 IU/L (ref 44–121)
BUN/Creatinine Ratio: 13 (ref 9–20)
BUN: 17 mg/dL (ref 6–24)
Bilirubin Total: 0.3 mg/dL (ref 0.0–1.2)
CO2: 22 mmol/L (ref 20–29)
Calcium: 10.1 mg/dL (ref 8.7–10.2)
Chloride: 100 mmol/L (ref 96–106)
Creatinine, Ser: 1.31 mg/dL — ABNORMAL HIGH (ref 0.76–1.27)
Globulin, Total: 2.4 g/dL (ref 1.5–4.5)
Glucose: 100 mg/dL — ABNORMAL HIGH (ref 70–99)
Potassium: 5.5 mmol/L — ABNORMAL HIGH (ref 3.5–5.2)
Sodium: 141 mmol/L (ref 134–144)
Total Protein: 7.3 g/dL (ref 6.0–8.5)
eGFR: 65 mL/min/{1.73_m2} (ref >59)

## 2022-07-24 LAB — SPECIMEN STATUS REPORT

## 2022-08-04 NOTE — Progress Notes (Signed)
Please Rx Repatha

## 2022-08-16 ENCOUNTER — Other Ambulatory Visit: Payer: Self-pay | Admitting: Cardiology

## 2022-08-16 ENCOUNTER — Other Ambulatory Visit: Payer: Self-pay

## 2022-08-16 DIAGNOSIS — R931 Abnormal findings on diagnostic imaging of heart and coronary circulation: Secondary | ICD-10-CM

## 2022-08-16 DIAGNOSIS — E7801 Familial hypercholesterolemia: Secondary | ICD-10-CM

## 2022-08-16 MED ORDER — REPATHA SURECLICK 140 MG/ML ~~LOC~~ SOAJ: 1.0000 mL | SUBCUTANEOUS | 3 refills | Status: DC

## 2022-08-26 ENCOUNTER — Encounter: Payer: Self-pay | Admitting: Podiatrist

## 2022-08-26 ENCOUNTER — Ambulatory Visit: Payer: Managed Care, Other (non HMO) | Admitting: Podiatrist

## 2022-08-26 DIAGNOSIS — M722 Plantar fascial fibromatosis: Secondary | ICD-10-CM

## 2022-08-26 MED ORDER — TRIAMCINOLONE ACETONIDE 10 MG/ML IJ SUSP
10.0000 mg | Freq: Once | INTRAMUSCULAR | Status: AC
  Administered 2022-08-26: 10 mg

## 2022-08-26 NOTE — Progress Notes (Signed)
Subjective: Julian Coffey is a 51 y.o. male patient presents to office with complaint of moderate heel pain on the right  heel. He relates significant improvement with the injection given by Dr. Ralene Cork and the use of the night splint.  He relates he has some residual pain and wonders if another injection might help resolve the issue.  He denies any signs or symptoms of infection.    There are no problems to display for this patient.   Current Outpatient Medications on File Prior to Visit  Medication Sig Dispense Refill   amLODipine-valsartan (EXFORGE) 10-320 MG tablet TAKE 1 TABLET BY MOUTH EVERY EVENING 90 tablet 0   Evolocumab (REPATHA SURECLICK) 140 MG/ML SOAJ Inject 140 mg into the skin every 14 (fourteen) days. 2 mL 3   omeprazole (PRILOSEC) 20 MG capsule Take 1 capsule by mouth daily.     No current facility-administered medications on file prior to visit.    Allergies  Allergen Reactions   Atorvastatin Calcium Other (See Comments)    Myalgia     Objective:  Physical Exam: Vascular: DP/PT pulses 2/4 bilateral. CFT <3 seconds. Normal hair growth on digits. No edema.  Skin. No lacerations or abrasions bilateral feet.  Musculoskeletal: MMT 5/5 bilateral lower extremities in DF, PF, Inversion and Eversion. Deceased ROM in DF of ankle joint. Tender to medial calcaneal tubercle on the right . No pain along arch achilles or PT. No pain with calcaneal squeeze.  Neurological: Sensation intact to light touch.   Assessment and Plan:   ICD-10-CM   1. Plantar fasciitis of right foot  M72.2 triamcinolone acetonide (KENALOG) 10 MG/ML injection 10 mg       -Complete examination performed.  -Xrays reviewed - agreed that another injection would be beneficial.  -After oral consent and aseptic prep, injected a mixture containing 10mg  Kenalog and 5mg  marcaine plain was infiltrated into the area of maximal tenderness of the Right heel. Post-injection care discussed with patient.  -Recommended good  supportive shoes and discussed inserts.  -recommended continued stretches and use of night splint.  -Patient will call for return appt in 3-4 weeks if symptoms worsen or fail to improve.

## 2022-08-26 NOTE — Patient Instructions (Signed)
Plantar Fasciitis (Heel Spur Syndrome) with Rehab The plantar fascia is a fibrous, ligament-like, soft-tissue structure that spans the bottom of the foot. Plantar fasciitis is a condition that causes pain in the foot due to inflammation of the tissue. SYMPTOMS  Pain and tenderness on the underneath side of the foot. Pain that worsens with standing or walking. CAUSES  Plantar fasciitis is caused by irritation and injury to the plantar fascia on the underneath side of the foot. Common mechanisms of injury include: Direct trauma to bottom of the foot. Damage to a small nerve that runs under the foot where the main fascia attaches to the heel bone. Stress placed on the plantar fascia due to any mild increased activity or injury RISK INCREASES WITH:  Obesity. Poor strength and flexibility. Improperly fitted shoes. Tight calf muscles. Flat feet. Failure to warm-up properly before activity.  PREVENTION Warm up and stretch properly before activity. Strength, flexibility Maintain a health body weight. Avoid stress on the plantar fascia. Wear properly fitted shoes, including arch supports for individuals who have flat feet. PROGNOSIS  If treated properly, then the symptoms of plantar fasciitis usually resolve without surgery. However, occasionally surgery is necessary. RELATED COMPLICATIONS  Recurrent symptoms that may result in a chronic condition. Problems of the lower back that are caused by compensating for the injury, such as limping. Pain or weakness of the foot during push-off following surgery. Chronic inflammation, scarring, and partial or complete fascia tear, occurring more often from repeated injections. TREATMENT  Treatment initially involves the use of ice and medication to help reduce pain and inflammation. The use of strengthening and stretching exercises may help reduce pain with activity, especially stretches of the Achilles tendon.  Your caregiver may recommend that you use  arch supports to help reduce stress on the plantar fascia. Often, corticosteroid injections are given to reduce inflammation. If symptoms persist for greater than 6 months despite non-surgical (conservative), then surgery may be recommended.  MEDICATION  If pain medication is necessary, then nonsteroidal anti-inflammatory medications, such as aspirin and ibuprofen, or other minor pain relievers, such as acetaminophen, are often recommended. Corticosteroid injections may be given by your caregiver.  HEAT AND COLD Cold treatment (icing) relieves pain and reduces inflammation. Cold treatment should be applied for 10 to 15 minutes every 2 to 3 hours for inflammation and pain and immediately after any activity that aggravates your symptoms. Use ice packs or massage the area with a piece of ice (ice massage). Heat treatment may be used prior to performing the stretching and strengthening activities prescribed by your caregiver, physical therapist, or athletic trainer. Use a heat pack or soak the injury in warm water. SEEK IMMEDIATE MEDICAL CARE IF: Treatment seems to offer no benefit, or the condition worsens. Any medications produce adverse side effects.  Perform this particular stretch daily first thing in the morning and before you go to bed. Hold for 30 seconds.    Try all the exercises and choose your favorite 3 to perform daily--  EXERCISES-- perform each exercise a total of 10-15 repetitions.  Hold for 30 seconds and perform 3 times per day   RANGE OF MOTION (ROM) AND STRETCHING EXERCISES - Plantar Fasciitis (Heel Spur Syndrome) These exercises may help you when beginning to rehabilitate your injury.   While completing these exercises, remember:  Restoring tissue flexibility helps normal motion to return to the joints. This allows healthier, less painful movement and activity. An effective stretch should be held for at least 30 seconds.   A stretch should never be painful. You should only feel  a gentle lengthening or release in the stretched tissue. RANGE OF MOTION - Toe Extension, Flexion Sit with your right / left leg crossed over your opposite knee. Grasp your toes and gently pull them back toward the top of your foot. You should feel a stretch on the bottom of your toes and/or foot. Hold this stretch for __________ seconds. Now, gently pull your toes toward the bottom of your foot. You should feel a stretch on the top of your toes and or foot. Hold this stretch for __________ seconds. Repeat __________ times. Complete this stretch __________ times per day.  RANGE OF MOTION - Ankle Dorsiflexion, Active Assisted Remove shoes and sit on a chair that is preferably not on a carpeted surface. Place right / left foot under knee. Extend your opposite leg for support. Keeping your heel down, slide your right / left foot back toward the chair until you feel a stretch at your ankle or calf. If you do not feel a stretch, slide your bottom forward to the edge of the chair, while still keeping your heel down. Hold this stretch for __________ seconds. Repeat __________ times. Complete this stretch __________ times per day.  STRETCH  Gastroc, Standing Place hands on wall. Extend right / left leg, keeping the front knee somewhat bent. Slightly point your toes inward on your back foot. Keeping your right / left heel on the floor and your knee straight, shift your weight toward the wall, not allowing your back to arch. You should feel a gentle stretch in the right / left calf. Hold this position for __________ seconds. Repeat __________ times. Complete this stretch __________ times per day. STRETCH  Soleus, Standing Place hands on wall. Extend right / left leg, keeping the other knee somewhat bent. Slightly point your toes inward on your back foot. Keep your right / left heel on the floor, bend your back knee, and slightly shift your weight over the back leg so that you feel a gentle stretch  deep in your back calf. Hold this position for __________ seconds. Repeat __________ times. Complete this stretch __________ times per day. STRETCH  Gastrocsoleus, Standing  Note: This exercise can place a lot of stress on your foot and ankle. Please complete this exercise only if specifically instructed by your caregiver.  Place the ball of your right / left foot on a step, keeping your other foot firmly on the same step. Hold on to the wall or a rail for balance. Slowly lift your other foot, allowing your body weight to press your heel down over the edge of the step. You should feel a stretch in your right / left calf. Hold this position for __________ seconds. Repeat this exercise with a slight bend in your right / left knee. Repeat __________ times. Complete this stretch __________ times per day.    

## 2022-09-29 ENCOUNTER — Other Ambulatory Visit: Payer: Self-pay | Admitting: Cardiology

## 2022-09-29 DIAGNOSIS — I10 Essential (primary) hypertension: Secondary | ICD-10-CM

## 2022-11-29 ENCOUNTER — Emergency Department (HOSPITAL_BASED_OUTPATIENT_CLINIC_OR_DEPARTMENT_OTHER): Payer: Managed Care, Other (non HMO) | Admitting: Radiology

## 2022-11-29 ENCOUNTER — Other Ambulatory Visit: Payer: Self-pay

## 2022-11-29 ENCOUNTER — Emergency Department (HOSPITAL_BASED_OUTPATIENT_CLINIC_OR_DEPARTMENT_OTHER)
Admission: EM | Admit: 2022-11-29 | Discharge: 2022-11-29 | Disposition: A | Discharge status: Home / Self Care | Payer: Managed Care, Other (non HMO) | Attending: Emergency Medicine | Admitting: Emergency Medicine

## 2022-11-29 ENCOUNTER — Encounter (HOSPITAL_BASED_OUTPATIENT_CLINIC_OR_DEPARTMENT_OTHER): Payer: Self-pay

## 2022-11-29 DIAGNOSIS — Z1152 Encounter for screening for COVID-19: Secondary | ICD-10-CM | POA: Insufficient documentation

## 2022-11-29 DIAGNOSIS — J208 Acute bronchitis due to other specified organisms: Secondary | ICD-10-CM

## 2022-11-29 DIAGNOSIS — I10 Essential (primary) hypertension: Secondary | ICD-10-CM | POA: Insufficient documentation

## 2022-11-29 DIAGNOSIS — R059 Cough, unspecified: Secondary | ICD-10-CM | POA: Diagnosis present

## 2022-11-29 DIAGNOSIS — Z79899 Other long term (current) drug therapy: Secondary | ICD-10-CM | POA: Diagnosis not present

## 2022-11-29 LAB — RESP PANEL BY RT-PCR (RSV, FLU A&B, COVID)  RVPGX2
Influenza A by PCR: NEGATIVE
Influenza B by PCR: NEGATIVE
Resp Syncytial Virus by PCR: NEGATIVE
SARS Coronavirus 2 by RT PCR: NEGATIVE

## 2022-11-29 MED ORDER — BENZONATATE 100 MG PO CAPS: 100.0000 mg | ORAL_CAPSULE | Freq: Three times a day (TID) | ORAL | 0 refills | Status: DC

## 2022-11-29 NOTE — ED Provider Notes (Signed)
[facility] Provider Note   CSN: [id number] Arrival date & time: 11/29/22  1054     History  Chief Complaint  Patient presents with   Cough    Julian Coffey is a 52 y.o. male.  Patient with past history of hypertension presents to the emergency department today for evaluation of cough ongoing over the past 3 to 4 days.  He denies associated fevers, ear pains.  He has had some congestion.  Some subjective fevers and chills but no documented fevers at home.  Afebrile on arrival here.  No treatments prior to arrival.  No chest pain or shortness of breath.  No known sick contacts.       Home Medications Prior to Admission medications   Medication Sig Start Date End Date Taking? Authorizing Provider  benzonatate (TESSALON) 100 MG capsule Take 1 capsule (100 mg total) by mouth every 8 (eight) hours. 11/29/22  Yes Carlisle Cater, PA-C  amLODipine-valsartan (EXFORGE) 10-320 MG tablet TAKE 1 TABLET BY MOUTH EVERY EVENING 09/29/22   Adrian Prows, MD  Evolocumab (REPATHA SURECLICK) [id number] MG/ML SOAJ Inject 140 mg into the skin every 14 (fourteen) days. 08/16/22   Adrian Prows, MD  omeprazole (PRILOSEC) 20 MG capsule Take 1 capsule by mouth daily. 02/11/17   [provider]      Allergies    Atorvastatin calcium    Review of Systems   Review of Systems  Physical Exam Updated Vital Signs BP (!) 134/90 (BP Location: Right Arm)   Pulse 74   Temp 98 F (36.7 C) (Oral)   Resp 16   Ht '5\' 10"'$  (1.778 m)   Wt 95.3 kg   SpO2 100%   BMI 30.13 kg/m   Physical Exam Vitals and nursing note reviewed.  Constitutional:      General: He is not in acute distress.    Appearance: He is well-developed.  HENT:     Head: Normocephalic and atraumatic.     Jaw: No trismus.     Right Ear: Tympanic membrane, ear canal and external ear normal.     Left Ear: Tympanic membrane, ear canal and external ear normal.     Nose: Nose normal. No mucosal edema or  rhinorrhea.     Mouth/Throat:     Mouth: Mucous membranes are not dry.     Pharynx: Uvula midline. No oropharyngeal exudate, posterior oropharyngeal erythema or uvula swelling.     Tonsils: No tonsillar abscesses.  Eyes:     General:        Right eye: No discharge.        Left eye: No discharge.     Conjunctiva/sclera: Conjunctivae normal.  Cardiovascular:     Rate and Rhythm: Normal rate and regular rhythm.     Heart sounds: Normal heart sounds.  Pulmonary:     Effort: Pulmonary effort is normal. No respiratory distress.     Breath sounds: Normal breath sounds. No wheezing or rales.  Abdominal:     Palpations: Abdomen is soft.     Tenderness: There is no abdominal tenderness.  Musculoskeletal:     Cervical back: Normal range of motion and neck supple.  Skin:    General: Skin is warm and dry.  Neurological:     Mental Status: He is alert.     ED Results / Procedures / Treatments   Labs (all labs ordered are listed, but only abnormal results are displayed) Labs Reviewed  RESP PANEL BY RT-PCR (  RSV, FLU A&B, COVID)  [id number]    EKG None  Radiology DG Chest 2 View  Result Date: 11/29/2022 CLINICAL DATA:  Cough. EXAM: CHEST - 2 VIEW COMPARISON:  Chest/rib radiographs 01/27/2014. FINDINGS: The cardiomediastinal silhouette is within normal limits. No airspace consolidation, edema, pleural effusion, or pneumothorax is identified. No acute osseous abnormality is seen. IMPRESSION: No active cardiopulmonary disease. Electronically Signed   By: Logan Bores M.D.   On: 11/29/2022 12:42    Procedures Procedures    Medications Ordered in ED Medications - No data to display  ED Course/ Medical Decision Making/ A&P    Patient seen and examined. History obtained directly from patient. Work-up including labs, imaging, EKG ordered in triage, if performed, were reviewed.    Labs/EKG: Independently reviewed and interpreted.  This included: Flu, COVID, RSV negative.  Imaging:  Independently reviewed and interpreted.  This included: Chest x-ray no sign of pneumonia  Medications/Fluids: None ordered.  Most recent vital signs reviewed and are as follows: BP (!) 134/90 (BP Location: Right Arm)   Pulse 74   Temp 98 F (36.7 C) (Oral)   Resp 16   Ht '5\' 10"'$  (1.778 m)   Wt 95.3 kg   SpO2 100%   BMI 30.13 kg/m   Initial impression: Viral bronchitis, cough  Home treatment plan: Prescription given for Tessalon, encouraged OTC meds as needed.  Return instructions discussed with patient: Return with new or worsening symptoms, worsening shortness of breath, trouble breathing, increased work of breathing, chest pain, fevers or other concerns.  Follow-up instructions discussed with patient: Follow-up with PCP in 5 days if not improving                            Medical Decision Making Amount and/or Complexity of Data Reviewed Radiology: ordered.  Risk Prescription drug management.   Patient with likely viral bronchitis.  He has had some cough and congestion.  No fevers here.  Chest x-ray clear without signs of pneumonia or failure.  Viral panel negative.   Antibiotics not indicated at this time given limited duration of symptoms. Conservative therapy indicated.           Final Clinical Impression(s) / ED Diagnoses Final diagnoses:  Viral bronchitis    Rx / DC Orders ED Discharge Orders          Ordered    benzonatate (TESSALON) 100 MG capsule  Every 8 hours        11/29/22 1318              Carlisle Cater, PA-C 11/29/22 1552    Regan Lemming, MD 11/29/22 [phone]

## 2022-11-29 NOTE — Discharge Instructions (Signed)
Please read and follow all provided instructions.  Your diagnoses today include:  1. Viral bronchitis     Tests performed today include: Chest x-ray - does not show any pneumonia Flu, COVID, RSV testing: Was negative Vital signs. See below for your results today.   Medications prescribed:  Tessalon Perles - cough suppressant medication  Take any prescribed medications only as directed.  Home care instructions:  Follow any educational materials contained in this packet.  Follow-up instructions: Please follow-up with your primary care provider in the next 5 days for further evaluation of your symptoms and a recheck if you are not feeling better.   Return instructions:  Please return to the Emergency Department if you experience worsening symptoms. Please return with worsening wheezing, shortness of breath, or difficulty breathing. Return with persistent fever above 101F.  Please return if you have any other emergent concerns.  Additional Information:  Your vital signs today were: BP (!) 134/90 (BP Location: Right Arm)   Pulse 74   Temp 98 F (36.7 C) (Oral)   Resp 16   Ht '5\' 10"'$  (1.778 m)   Wt 95.3 kg   SpO2 100%   BMI 30.13 kg/m  If your blood pressure (BP) was elevated above 135/85 this visit, please have this repeated by your doctor within one month. --------------

## 2022-11-29 NOTE — ED Notes (Signed)
Pt left before discharge paperwork could be given or explained... Provider stated that he explained everything related to the visit today to him.Marland KitchenMarland Kitchen

## 2022-11-29 NOTE — ED Triage Notes (Signed)
Patient here POV from Home.  Endorses Cough that began 3 Days ago. Mostly Dry.   No Sore Throat. Mild Congestion. Subjective Fever. Some Chills.  NAD Noted during Triage. A&Ox4. GCS 15. Ambulatory.

## 2023-01-04 ENCOUNTER — Other Ambulatory Visit: Payer: Self-pay | Admitting: Cardiology

## 2023-01-04 DIAGNOSIS — I10 Essential (primary) hypertension: Secondary | ICD-10-CM

## 2023-02-20 ENCOUNTER — Other Ambulatory Visit: Payer: Self-pay | Admitting: Cardiology

## 2023-02-20 DIAGNOSIS — R931 Abnormal findings on diagnostic imaging of heart and coronary circulation: Secondary | ICD-10-CM

## 2023-02-20 DIAGNOSIS — E7801 Familial hypercholesterolemia: Secondary | ICD-10-CM

## 2023-03-30 ENCOUNTER — Other Ambulatory Visit: Payer: Self-pay | Admitting: Cardiology

## 2023-03-30 DIAGNOSIS — I10 Essential (primary) hypertension: Secondary | ICD-10-CM

## 2023-06-18 ENCOUNTER — Other Ambulatory Visit: Payer: Self-pay | Admitting: Cardiology

## 2023-06-18 DIAGNOSIS — I10 Essential (primary) hypertension: Secondary | ICD-10-CM

## 2023-06-24 ENCOUNTER — Ambulatory Visit: Payer: Self-pay | Admitting: Cardiology

## 2023-06-24 ENCOUNTER — Other Ambulatory Visit: Payer: Self-pay | Admitting: Cardiology

## 2023-06-24 DIAGNOSIS — E7801 Familial hypercholesterolemia: Secondary | ICD-10-CM

## 2023-06-24 DIAGNOSIS — R931 Abnormal findings on diagnostic imaging of heart and coronary circulation: Secondary | ICD-10-CM

## 2023-07-19 ENCOUNTER — Other Ambulatory Visit (HOSPITAL_BASED_OUTPATIENT_CLINIC_OR_DEPARTMENT_OTHER): Payer: Self-pay

## 2023-07-19 MED ORDER — ZOSTER VAC RECOMB ADJUVANTED 50 MCG/0.5ML IM SUSR
0.5000 mL | Freq: Once | INTRAMUSCULAR | 0 refills | Status: AC
  Filled 2023-07-19: qty 0.5, 1d supply, fill #0

## 2023-11-24 ENCOUNTER — Telehealth: Payer: Self-pay | Admitting: Cardiology

## 2023-11-24 NOTE — Telephone Encounter (Signed)
 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   Date: 11/24/2023  Location of paper:  Dr. Jacinto Halim [employer]

## 2023-11-28 NOTE — Telephone Encounter (Signed)
 Form is comprehensive medical examination checklist.  Patient last seen by Dr Jacinto Halim in 10/23.  I placed call to patient and left message to call office

## 2023-11-29 NOTE — Telephone Encounter (Signed)
 Reviewed with Dr Jacinto Halim and he can complete cardiac portion of the exam after next office visit.  Remainder of paperwork will need to be completed by patient's PCP.  Per DPR it is OK to leave a detailed message.  Message left that I was calling regarding paperwork he dropped off in the office and to please call office.

## 2023-11-29 NOTE — Telephone Encounter (Signed)
 I spoke with patient and gave him message from Dr Jacinto Halim.  Patient reports he will have someone else fill out the paperwork and he does not need Dr Jacinto Halim to complete it.  Patient does not want to schedule follow up with Dr Jacinto Halim at this time.

## 2023-11-29 NOTE — Telephone Encounter (Signed)
 Patient returned RN's call.

## 2024-06-08 ENCOUNTER — Other Ambulatory Visit: Payer: Self-pay | Admitting: Cardiology

## 2024-06-08 DIAGNOSIS — I10 Essential (primary) hypertension: Secondary | ICD-10-CM

## 2024-07-09 ENCOUNTER — Other Ambulatory Visit: Payer: Self-pay | Admitting: Cardiology

## 2024-07-09 DIAGNOSIS — I10 Essential (primary) hypertension: Secondary | ICD-10-CM

## 2024-07-13 ENCOUNTER — Telehealth: Payer: Self-pay | Admitting: Cardiology

## 2024-07-13 DIAGNOSIS — I10 Essential (primary) hypertension: Secondary | ICD-10-CM

## 2024-07-13 MED ORDER — AMLODIPINE BESYLATE-VALSARTAN 10-320 MG PO TABS
1.0000 | ORAL_TABLET | Freq: Every evening | ORAL | 0 refills | Status: AC
Start: 2024-07-13 — End: ?

## 2024-07-13 MED ORDER — AMLODIPINE BESYLATE-VALSARTAN 10-320 MG PO TABS
1.0000 | ORAL_TABLET | Freq: Every evening | ORAL | 0 refills | Status: DC
Start: 2024-07-13 — End: 2024-07-13

## 2024-07-13 NOTE — Telephone Encounter (Signed)
*  STAT* If patient is at the pharmacy, call can be transferred to refill team.   1. Which medications need to be refilled? (please list name of each medication and dose if known) amLODipine -valsartan  (EXFORGE ) 10-320 MG tablet   2. Which pharmacy/location (including street and city if local pharmacy) is medication to be sent to?  [id number] - [place], [place] - [address]    3. Do they need a 30 day or 90 day supply? 90  Patient has appt 1/19

## 2024-07-13 NOTE — Addendum Note (Signed)
 Addended by: BLUFORD, Marji Kuehnel L on: 07/13/2024 02:56 PM   Modules accepted: Orders

## 2024-07-13 NOTE — Telephone Encounter (Signed)
 RX sent in

## 2024-10-08 ENCOUNTER — Encounter: Payer: Self-pay | Admitting: Cardiology

## 2024-10-08 ENCOUNTER — Ambulatory Visit: Admitting: Cardiology

## 2024-10-08 ENCOUNTER — Other Ambulatory Visit (HOSPITAL_BASED_OUTPATIENT_CLINIC_OR_DEPARTMENT_OTHER): Payer: Self-pay

## 2024-10-08 VITALS — BP 138/90 | HR 67 | Ht 70.0 in | Wt 212.0 lb

## 2024-10-08 DIAGNOSIS — I10 Essential (primary) hypertension: Secondary | ICD-10-CM

## 2024-10-08 DIAGNOSIS — T466X5D Adverse effect of antihyperlipidemic and antiarteriosclerotic drugs, subsequent encounter: Secondary | ICD-10-CM | POA: Diagnosis not present

## 2024-10-08 DIAGNOSIS — E78011 Heterozygous familial hypercholesterolemia (hefh): Secondary | ICD-10-CM

## 2024-10-08 DIAGNOSIS — G720 Drug-induced myopathy: Secondary | ICD-10-CM

## 2024-10-08 DIAGNOSIS — R931 Abnormal findings on diagnostic imaging of heart and coronary circulation: Secondary | ICD-10-CM | POA: Diagnosis not present

## 2024-10-08 NOTE — Progress Notes (Signed)
 " Cardiology Office Note:  .   Date:  10/08/2024  ID:  Julian Coffey, DOB Nov 04, 1970, MRN [id number] PCP: Freddrick Johns  [facility] Providers Cardiologist:  Gordy Bergamo, MD   History of Present Illness: .   Julian Coffey is a 54 y.o. Caucasian male with hypertension, marked hyperlipidemia suggestive of familial hyperlipidemia, coronary calcium  score of 30.8 placing him in 75th percentile for his age and sex matched individual presents for annual visit.  He could not tolerate statins due to statin myopathy and marked fatigue. He was started on Repatha , but discontinued this due to fear of adverse effects on getting lipids down.  He continues to exercise rigorously, he is presently fasting for the last 3 days and will do it for additional 2 days and keeps himself well-hydrated.    Discussed the use of AI scribe software for clinical note transcription with the patient, who gave verbal consent to proceed.  History of Present Illness Julian Coffey is a 55 year old male with hypertension and hypercholesterolemia who presents for cardiovascular risk assessment and management.  He has severe hypercholesterolemia with total cholesterol 325 mg/dL, LDL 764 mg/dL, and HDL 55 mg/dL from September 2023 and reports familial hypercholesterolemia in his family. He previously used Repatha  but stopped it by choice and is not taking any cholesterol-lowering medication.  He has hypertension treated with amlodipine  and valsartan , with a recent home blood pressure of 132/76 mmHg. He is highly physically active, running 10 to 15 miles weekly and lifting heavy weights.  He has a coronary calcium  score of 30.8 on CT from 2021, which was in the 75th percentile for his age, and has undergone a stress test.  He is reluctant to start additional medications, especially statins, due to prior muscle aches and worries about possible long-term cognitive effects of cholesterol drugs, including dementia.  Cardiac Studies  relevent.    Exercise Sestamibi stress test 06/11/2020: Exercise nuclear stress test was performed using Bruce protocol. Patient reached 14 METS, and 104% of age predicted maximum heart rate. Exercise capacity was excellent. Chest pain not reported. Heart rate and hemodynamic response were normal. Stress EKG revealed no ischemic changes. Mild decrease in tracer uptake in inferior myocardium most likely due to tissue attenuation.  Stress LVEF 51%. Low risk study.   Coronary calcium  score 06/25/2020: 1. LAD and LEFT circumflex coronary artery calcification. 2. Total Agatston Score: 30.8 3. MESA age and sex matched database percentile: 47   ECHOCARDIOGRAM COMPLETE 09/30/2021 Normal LV systolic function with visual EF 60-65%. Left ventricle cavity is normal in size. Mild concentric hypertrophy of the left ventricle. Normal global wall motion. Normal diastolic filling pattern, normal LAP. Trace aortic regurgitation. Mild pulmonic regurgitation. Compared to 05/05/2020 LVEF improved from 45-50% to 60-65%, Aortic root was 3.9cm now 3.7cm, Mild AR is now trace, otherwise no significant change.   EKG:   EKG Interpretation Date/Time:  Monday October 08 2024 09:08:41 EST Ventricular Rate:  67 PR Interval:  166 QRS Duration:  76 QT Interval:  388 QTC Calculation: 409 R Axis:   -22  Text Interpretation: EKG 10/08/2024: Normal sinus rhythm at the rate of 67 bpm, normal EKG.  Compared to 01/27/2014, no change. Confirmed by Yumi Insalaco, Jagadeesh [phone]) on 10/08/2024 9:20:03 AM  Labs   Lab Results  Component Value Date   CHOL 325 (H) 07/01/2022   HDL 55 07/01/2022   LDLCALC 235 (H) 07/01/2022   TRIG 178 (H) 07/01/2022   Lipoprotein (a)  Date/Time  Value Ref Range Status  07/01/2022 10:34 AM 49.2 <75.0 nmol/L Final    Comment:    Note:  Values greater than or equal to 75.0 nmol/L may        indicate an independent risk factor for CHD,        but must be evaluated with caution when applied        to  non-Caucasian populations due to the        influence of genetic factors on Lp(a) across        ethnicities.     No results for input(s): NA, K, CL, CO2, GLUCOSE, BUN, CREATININE, CALCIUM , GFRNONAA, GFRAA in the last 8760 hours.  Lab Results  Component Value Date   ALT 49 (H) 07/01/2022   AST 40 07/01/2022   ALKPHOS 88 07/01/2022   BILITOT 0.3 07/01/2022      Latest Ref Rng & Units 05/01/2020    8:17 AM  CBC  WBC 3.4 - 10.8 x10E3/uL 3.5   Hemoglobin 13.0 - 17.7 g/dL 83.2   Hematocrit 62.4 - 51.0 % 46.4   Platelets 150 - 450 x10E3/uL 231    No results found for: HGBA1C  Lab Results  Component Value Date   TSH 0.924 05/01/2020   ROS  Review of Systems  Cardiovascular:  Negative for chest pain, dyspnea on exertion and leg swelling.   Physical Exam:   VS:  BP (!) 138/90 (BP Location: Right Arm, Patient Position: Sitting, Cuff Size: Large)   Pulse 67   Ht 5' 10 (1.778 m)   Wt 212 lb (96.2 kg)   SpO2 98%   BMI 30.42 kg/m    Wt Readings from Last 3 Encounters:  10/08/24 212 lb (96.2 kg)  11/29/22 210 lb (95.3 kg)  06/23/22 217 lb 6.4 oz (98.6 kg)    BP Readings from Last 3 Encounters:  10/08/24 (!) 138/90  11/29/22 (!) 134/90  06/23/22 122/80   Physical Exam Neck:     Vascular: No carotid bruit or JVD.  Cardiovascular:     Rate and Rhythm: Normal rate and regular rhythm.     Pulses: Intact distal pulses.     Heart sounds: Normal heart sounds. No murmur heard.    No gallop.  Pulmonary:     Effort: Pulmonary effort is normal.     Breath sounds: Normal breath sounds.  Abdominal:     General: Bowel sounds are normal.     Palpations: Abdomen is soft.  Musculoskeletal:     Right lower leg: No edema.     Left lower leg: No edema.     ASSESSMENT AND PLAN: .      ICD-10-CM   1. Elevated coronary artery calcium  score 06/25/20: Total Agatston Score: 30.8 MESA percentile 75.  R93.1 EKG 12-Lead    Apo A1 + B + Ratio    Lipid Panel With  LDL/HDL Ratio    High sensitivity CRP    2. Primary hypertension  I10 EKG 12-Lead    Apo A1 + B + Ratio    Lipid Panel With LDL/HDL Ratio    High sensitivity CRP    3. 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 White coat syndrome with diagnosis of hypertension  I10 Apo A1 + B + Ratio    Lipid Panel With LDL/HDL Ratio    High sensitivity CRP     Assessment & Plan Heterozygous familial hypercholesterolemia Elevated total cholesterol at 325 mg/dL and LDL at 764 mg/dL as of September 7976. Coronary artery calcium  score of 30.8 in 2021 places him at the 75th percentile for cardiovascular risk. He is hesitant to use cholesterol-lowering medications due to concerns about side effects and personal beliefs about cholesterol. Discussed the risks of untreated hypercholesterolemia, including plaque buildup and potential cardiovascular events. Explained that cholesterol-lowering medications can reduce cardiovascular risk by more than 50%. - Ordered lipid panel to assess current cholesterol levels. - Discussed non-statin options such as Nexletol with him. - Will discuss results and treatment options over the phone after lab results are available. - CRP, Apo A1+ B ratio, Fasting lipid panel today  Elevated coronary artery calcium  score Coronary artery calcium  score of 30.8 in 2021 indicates plaque buildup and increased cardiovascular risk. Discussed the implications of the calcium  score, including the presence of plaque and the potential for future cardiovascular events. Explained that the calcium  score reflects the presence of plaque and the risk of future cardiovascular events. - Discussed the implications of the coronary artery calcium  score with him. - Will  consider coronary CT angiogram if he is willing, though insurance may not approve it due to lack of symptoms.  Primary hypertension Recent home reading was 132/76 mmHg. - Continue current antihypertensive regimen with amlodipine  and valsartan .  Statin myopathy Previous statin therapy resulted in muscle aches and pains, leading to discontinuation. He is hesitant to use statins again due to past experiences. Discussed alternative cholesterol-lowering options, including non-statin medications like Nexletol. - Discussed non-statin options such as Nexletol with him.  Follow up: 1 Year and will discuss his labs on reporting.   Signed,  Gordy Bergamo, MD, [facility] 10/08/2024, 10:07 AM [facility] [address] [place], [place] [address] Phone: [phone]. Fax:  [phone]  "

## 2024-10-08 NOTE — Patient Instructions (Addendum)
 Medication Instructions:  Your physician recommends that you continue on your current medications as directed. Please refer to the Current Medication list given to you today.  *If you need a refill on your cardiac medications before your next appointment, please call your pharmacy*  Lab Work: (TODAY) Lab Orders         Apo A1 + B + Ratio         Lipid Panel With LDL/HDL Ratio         High sensitivity CRP     If you have labs (blood work) drawn today and your tests are completely normal, you will receive your results only by: MyChart Message (if you have MyChart) OR A paper copy in the mail If you have any lab test that is abnormal or we need to change your treatment, we will call you to review the results.   Follow-Up: At [facility], you and your health needs are our priority.  As part of our continuing mission to provide you with exceptional heart care, our providers are all part of one team.  This team includes your primary Cardiologist (physician) and Advanced Practice Providers or APPs (Physician Assistants and Nurse Practitioners) who all work together to provide you with the care you need, when you need it.  Your next appointment:   1 year(s)  Provider:   Gordy Bergamo, MD    We recommend signing up for the patient portal called MyChart.  Sign up information is provided on this After Visit Summary.  MyChart is used to connect with patients for Virtual Visits (Telemedicine).  Patients are able to view lab/test results, encounter notes, upcoming appointments, etc.  Non-urgent messages can be sent to your provider as well.   To learn more about what you can do with MyChart, go to [email or web address].

## 2024-10-09 LAB — LIPID PANEL WITH LDL/HDL RATIO
Cholesterol, Total: 349 mg/dL — ABNORMAL HIGH (ref 100–199)
HDL: 61 mg/dL
LDL Chol Calc (NIH): 236 mg/dL — ABNORMAL HIGH (ref 0–99)
LDL/HDL Ratio: 3.9 ratio — ABNORMAL HIGH (ref 0.0–3.6)
Triglycerides: 252 mg/dL — ABNORMAL HIGH (ref 0–149)
VLDL Cholesterol Cal: 52 mg/dL — ABNORMAL HIGH (ref 5–40)

## 2024-10-09 LAB — APO A1 + B + RATIO
Apolipo. B/A-1 Ratio: 1 ratio — AB (ref 0.0–0.7)
Apolipoprotein A-1: 184 mg/dL — AB (ref 101–178)
Apolipoprotein B: 179 mg/dL — AB

## 2024-10-09 LAB — HIGH SENSITIVITY CRP: CRP, High Sensitivity: 2.14 mg/L (ref 0.00–3.00)

## 2024-10-11 ENCOUNTER — Ambulatory Visit: Payer: Self-pay | Admitting: Cardiology

## 2024-10-11 DIAGNOSIS — E78011 Heterozygous familial hypercholesterolemia (hefh): Secondary | ICD-10-CM

## 2024-10-11 DIAGNOSIS — R931 Abnormal findings on diagnostic imaging of heart and coronary circulation: Secondary | ICD-10-CM

## 2024-10-11 NOTE — Progress Notes (Signed)
"    ICD-10-CM   1. Elevated coronary artery calcium  score  R93.1 CT CORONARY MORPH W/CTA COR W/SCORE W/CA W/CM &/OR WO/CM    2. Heterozygous familial hypercholesterolemia  E78.011 CT CORONARY MORPH W/CTA COR W/SCORE W/CA W/CM &/OR WO/CM     Orders Placed This Encounter  Procedures   CT CORONARY MORPH W/CTA COR W/SCORE W/CA W/CM &/OR WO/CM    Standing Status:   Future    Expiration Date:   01/09/2025    If indicated for the ordered procedure, I authorize the administration of contrast media per Radiology protocol:   Yes    [facility]   Patient is aware of the results, in view of familial hypercholesterolemia, abnormal apolipoprotein B/A1 ratio, elevated coronary calcium  score, LDL of 236, will proceed with coronary CTA for definitive diagnosis of CAD which would help with therapeutic monitoring and initiation and maintenance of lipid-lowering therapy. "

## 2024-10-23 ENCOUNTER — Encounter (HOSPITAL_COMMUNITY): Payer: Self-pay

## 2024-10-24 ENCOUNTER — Ambulatory Visit (HOSPITAL_COMMUNITY)
Admission: RE | Admit: 2024-10-24 | Discharge: 2024-10-24 | Disposition: A | Source: Ambulatory Visit | Attending: Cardiovascular Disease | Admitting: Cardiovascular Disease

## 2024-10-24 DIAGNOSIS — E78011 Heterozygous familial hypercholesterolemia (hefh): Secondary | ICD-10-CM

## 2024-10-24 DIAGNOSIS — R931 Abnormal findings on diagnostic imaging of heart and coronary circulation: Secondary | ICD-10-CM

## 2024-10-24 MED ORDER — NITROGLYCERIN 0.4 MG SL SUBL
0.8000 mg | SUBLINGUAL_TABLET | Freq: Once | SUBLINGUAL | Status: AC
  Administered 2024-10-24: 0.8 mg via SUBLINGUAL

## 2024-10-24 MED ORDER — IOHEXOL 350 MG/ML SOLN
100.0000 mL | Freq: Once | INTRAVENOUS | Status: AC | PRN
  Administered 2024-10-24: 100 mL via INTRAVENOUS

## 2024-10-25 ENCOUNTER — Other Ambulatory Visit: Payer: Self-pay | Admitting: Cardiology

## 2024-10-25 ENCOUNTER — Ambulatory Visit (HOSPITAL_COMMUNITY)
Admission: RE | Admit: 2024-10-25 | Discharge: 2024-10-25 | Disposition: A | Source: Ambulatory Visit | Attending: Cardiology | Admitting: Cardiology

## 2024-10-25 ENCOUNTER — Ambulatory Visit: Payer: Self-pay | Admitting: Cardiology

## 2024-10-25 DIAGNOSIS — R931 Abnormal findings on diagnostic imaging of heart and coronary circulation: Secondary | ICD-10-CM

## 2024-10-25 NOTE — Progress Notes (Signed)
 Coronary CTA 10/26/2023: 1. Coronary calcium  score of 55.8. This was 72nd percentile for age-, sex, and race-matched controls.   2. Normal coronary origin with left dominance.   3. Moderate atherosclerosis: 50-69% mid LAD; 25-49% ostial RCA.   4. FFR analysis does not suggest hemodynamically significant stenosis.

## 2024-10-26 ENCOUNTER — Ambulatory Visit: Payer: Self-pay | Admitting: Cardiology
